# Patient Record
Sex: Female | Born: 1995 | Race: Black or African American | Hispanic: No | Marital: Single | Smoking: Current every day smoker
Health system: Southern US, Community
[De-identification: ages and names within clinical notes are randomized; demographics above are authoritative.]

## PROBLEM LIST (undated history)

## (undated) DIAGNOSIS — K649 Unspecified hemorrhoids: Secondary | ICD-10-CM

## (undated) DIAGNOSIS — K5900 Constipation, unspecified: Secondary | ICD-10-CM

## (undated) HISTORY — PX: OTHER SURGICAL HISTORY: SHX169

## (undated) HISTORY — PX: NOSE SURGERY: SHX723

## (undated) HISTORY — PX: HERNIA REPAIR: SHX51

---
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
## 2013-01-18 ENCOUNTER — Emergency Department (HOSPITAL_COMMUNITY)
Admission: EM | Admit: 2013-01-18 | Discharge: 2013-01-18 | Disposition: A | Discharge status: Home / Self Care | Payer: No Typology Code available for payment source | Source: Home / Self Care | Attending: Family Medicine | Admitting: Family Medicine

## 2013-01-18 ENCOUNTER — Encounter (HOSPITAL_COMMUNITY): Payer: Self-pay | Admitting: Emergency Medicine

## 2013-01-18 DIAGNOSIS — K644 Residual hemorrhoidal skin tags: Secondary | ICD-10-CM

## 2013-01-18 MED ORDER — HYDROCORTISONE ACETATE 25 MG RE SUPP: 25.0000 mg | Freq: Two times a day (BID) | RECTAL | Status: DC

## 2013-01-18 MED ORDER — HYDROCORTISONE 2.5 % RE CREA: TOPICAL_CREAM | RECTAL | Status: DC

## 2013-01-18 NOTE — ED Notes (Signed)
Pt c/o hemorrhoids x 3 days. This is a recurrent problem. Has had regular bowel movements, last was yesterday. Denies abdominal cramping or fever. Took ibuprofen Tuesday with relief. Patient is alert and oriented.

## 2013-01-18 NOTE — ED Provider Notes (Signed)
Medical screening examination/treatment/procedure(s) were performed by non-physician practitioner and as supervising physician I was immediately available for consultation/collaboration.  Juliannah Ohmann, M.D.  Trista Ciocca C Nancyann Cotterman, MD 01/18/13 2115 

## 2013-01-18 NOTE — ED Provider Notes (Signed)
History     CSN: [id number]  Arrival date & time 01/18/13  1214   First MD Initiated Contact with Patient 01/18/13 1345      Chief Complaint  Patient presents with  . Hemorrhoids    Patient is a 17 y.o. female presenting with GI illness. The history is provided by the patient.  GI Problem This is a recurrent problem. The current episode started 3 to 5 hours ago. The problem occurs constantly. The problem has been gradually worsening.  Patient reports recent difficulties with constipation. Earlier today after a BM patient noted painful hemorrhoid. States she has had a hemorrhoids as well in the past. Also admits to a tendency toward constipation. Patient denies abdominal pain or rectal bleeding.  History reviewed. No pertinent past medical history.  History reviewed. No pertinent past surgical history.  No family history on file.  History  Substance Use Topics  . Smoking status: Never Smoker   . Smokeless tobacco: Not on file  . Alcohol Use: No    OB History   Grav Para Term Preterm Abortions TAB SAB Ect Mult Living                  Review of Systems  Constitutional: Negative.   HENT: Negative.   Eyes: Negative.   Respiratory: Negative.   Cardiovascular: Negative.   Gastrointestinal: Positive for constipation.  Genitourinary: Negative.   Musculoskeletal: Negative.   Skin: Negative.   Allergic/Immunologic: Negative.   Neurological: Negative.   Hematological: Negative.   Psychiatric/Behavioral: Negative.     Allergies  Review of patient's allergies indicates not on file.  Home Medications   Current Outpatient Rx  Name  Route  Sig  Dispense  Refill  . hydrocortisone (ANUSOL-HC) 2.5 % rectal cream      Apply to external hemorrhoids 2 times daily and after each bowel movement.   30 g   0   . hydrocortisone (ANUSOL-HC) 25 MG suppository   Rectal   Place 1 suppository (25 mg total) rectally 2 (two) times daily.   12 suppository   0     BP 103/70   Pulse 69  Temp(Src) 98.6 F (37 C) (Oral)  SpO2 100%  LMP 01/13/2013  Physical Exam  Constitutional: She is oriented to person, place, and time. She appears well-developed and well-nourished.  HENT:  Head: Normocephalic and atraumatic.  Eyes: Conjunctivae are normal.  Cardiovascular: Normal rate.   Pulmonary/Chest: Effort normal.  Genitourinary: Rectal exam shows external hemorrhoid.  Approximate 2.5 cm external hemorrhoid noted. Pink and flesh colored. Is not thrombosed. No rectal bleeding.  No other abnormalities noted.  Musculoskeletal: Normal range of motion.  Neurological: She is alert and oriented to person, place, and time.  Skin: Skin is warm and dry.    ED Course  Procedures (including critical care time)  Labs Reviewed - No data to display No results found.   1. Hemorrhoids, external       MDM  Painful external hemorrhoid noted today after recent difficulties w/ constipation. 2.5 cm hemorrhoid, not thrombosed. Will recommend daily stool softner, rest, minimal sitting and treat w/ short course of Anusol supp and cream.         Roma Kayser Laylia Mui, NP 01/18/13 1622

## 2013-01-18 NOTE — ED Notes (Signed)
Patient also recived work note

## 2013-01-22 ENCOUNTER — Encounter (HOSPITAL_COMMUNITY): Payer: Self-pay | Admitting: Emergency Medicine

## 2013-01-22 ENCOUNTER — Emergency Department (HOSPITAL_COMMUNITY)
Admission: EM | Admit: 2013-01-22 | Discharge: 2013-01-22 | Disposition: A | Discharge status: Home / Self Care | Payer: No Typology Code available for payment source | Source: Home / Self Care

## 2013-01-22 DIAGNOSIS — K645 Perianal venous thrombosis: Secondary | ICD-10-CM

## 2013-01-22 HISTORY — DX: Constipation, unspecified: K59.00

## 2013-01-22 MED ORDER — IBUPROFEN 600 MG PO TABS: 600.0000 mg | ORAL_TABLET | Freq: Four times a day (QID) | ORAL | Status: DC | PRN

## 2013-01-22 NOTE — ED Notes (Signed)
Patient aware of reason for delay

## 2013-01-22 NOTE — ED Notes (Signed)
Delay in discharging patient .  Patient requested prescription pain medication .  Also patient needed a school note not found in discharge packet prepared by mabe, np.

## 2013-01-22 NOTE — ED Notes (Signed)
At bedside for Gabrielle Rasmussen, np exam

## 2013-01-22 NOTE — ED Provider Notes (Signed)
History     CSN: [id number]  Arrival date & time 01/22/13  1002   First MD Initiated Contact with Patient 01/22/13 1016      Chief Complaint  Patient presents with  . Hemorrhoids    (Consider location/radiation/quality/duration/timing/severity/associated sxs/prior treatment) HPI Comments: 17 year old female complaining of persistent hemorrhoidal symptoms. She was seen in the urgent care 4 days ago and treated with Anusol cream and suppositories. She has not been taking stool softeners but instead chose to take fiber pills. She states there is no improvement in her hemorrhoid size or discomfort. Denies bleeding. She has a history of constipation and straining.    Past Medical History  Diagnosis Date  . Constipation     History reviewed. No pertinent past surgical history.  No family history on file.  History  Substance Use Topics  . Smoking status: Never Smoker   . Smokeless tobacco: Not on file  . Alcohol Use: No    OB History   Grav Para Term Preterm Abortions TAB SAB Ect Mult Living                  Review of Systems  Constitutional: Negative.   Respiratory: Negative.   Gastrointestinal: Positive for rectal pain. Negative for nausea, vomiting, abdominal pain, diarrhea and anal bleeding.  Genitourinary: Negative.   Skin: Negative.   Psychiatric/Behavioral: Negative.     Allergies  Review of patient's allergies indicates no known allergies.  Home Medications   Current Outpatient Rx  Name  Route  Sig  Dispense  Refill  . hydrocortisone (ANUSOL-HC) 2.5 % rectal cream      Apply to external hemorrhoids 2 times daily and after each bowel movement.   30 g   0   . hydrocortisone (ANUSOL-HC) 25 MG suppository   Rectal   Place 1 suppository (25 mg total) rectally 2 (two) times daily.   12 suppository   0     BP 112/77  Pulse 85  Temp(Src) 98 F (36.7 C) (Oral)  Resp 16  SpO2 100%  LMP 01/13/2013  Physical Exam  Nursing note and vitals  reviewed. Constitutional: She is oriented to person, place, and time. She appears well-developed and well-nourished. No distress.  Eyes: EOM are normal.  Neck: Normal range of motion. Neck supple.  Pulmonary/Chest: Effort normal.  Genitourinary:  Large nonbleeding hemorroid, tender.   Musculoskeletal: Normal range of motion. She exhibits no edema.  Neurological: She is alert and oriented to person, place, and time. She exhibits normal muscle tone.  Skin: Skin is warm and dry. No rash noted.  Psychiatric: She has a normal mood and affect.    ED Course  Thrombectomy Date/Time: 01/22/2013 11:01 AM Performed by: Phineas Real, Keyani Rigdon Authorized by: Bradd Canary D Consent: Verbal consent obtained. Risks and benefits: risks, benefits and alternatives were discussed Consent given by: patient Patient understanding: patient states understanding of the procedure being performed Patient identity confirmed: verbally with patient Preparation: Patient was prepped and draped in the usual sterile fashion. Local anesthesia used: yes Local anesthetic: lidocaine 2% with epinephrine Anesthetic total: 2 ml Patient sedated: no Patient tolerance: Patient tolerated the procedure well with no immediate complications. Comments: A 2 cm longitudinal incision was made over the hemorrhoid. The clot was expressed and the contents cleaned. After removal of the contents a dressing was placed over the op site.   (including critical care time)  Labs Reviewed - No data to display No results found.   1. Thrombosed external hemorrhoid  MDM  The patient was instructed to go home and sit in a very warm tub or and soak. To that 2-3 times a day if possible. Keep the incision to him he recovered with a bandage for the next 3-4 days, as long as straining. Postop, bleeding was quite minimal. Continue with the topical hydrocortisone cream and Anusol suppositories. Obtain the stool softener and use 2-3 times a day to soften  the stool and continued fiber contents. Recommend cleaning the area with liquid spray such as in a bottle and careful dabbing her wiping the area. Recheck promptly for any new symptoms problems or worsening        Hayden Rasmussen, NP 01/22/13 1106  Hayden Rasmussen, NP 01/22/13 [phone]

## 2013-01-22 NOTE — ED Notes (Signed)
Reports being seen before for hemorrhoids per patient.  Reports being seen Friday.  Patient reports the same pain.

## 2013-01-22 NOTE — ED Notes (Signed)
Assisted with procedure in treatment room.  Patient currently dressing

## 2013-01-25 NOTE — ED Provider Notes (Signed)
Medical screening examination/treatment/procedure(s) were performed by resident physician or non-physician practitioner and as supervising physician I was immediately available for consultation/collaboration.   Barkley Bruns MD.   Linna Hoff, MD 01/25/13 2017

## 2014-05-05 ENCOUNTER — Encounter (HOSPITAL_COMMUNITY): Payer: Self-pay | Admitting: Emergency Medicine

## 2014-05-05 ENCOUNTER — Emergency Department (HOSPITAL_COMMUNITY)
Admission: EM | Admit: 2014-05-05 | Discharge: 2014-05-05 | Disposition: A | Discharge status: Home / Self Care | Payer: No Typology Code available for payment source | Attending: Emergency Medicine | Admitting: Emergency Medicine

## 2014-05-05 ENCOUNTER — Emergency Department (HOSPITAL_COMMUNITY): Payer: No Typology Code available for payment source

## 2014-05-05 ENCOUNTER — Emergency Department (HOSPITAL_COMMUNITY)
Admission: EM | Admit: 2014-05-05 | Discharge: 2014-05-06 | Disposition: A | Discharge status: Home / Self Care | Payer: No Typology Code available for payment source | Attending: Emergency Medicine | Admitting: Emergency Medicine

## 2014-05-05 DIAGNOSIS — F17200 Nicotine dependence, unspecified, uncomplicated: Secondary | ICD-10-CM | POA: Insufficient documentation

## 2014-05-05 DIAGNOSIS — R1033 Periumbilical pain: Secondary | ICD-10-CM | POA: Insufficient documentation

## 2014-05-05 DIAGNOSIS — N390 Urinary tract infection, site not specified: Secondary | ICD-10-CM | POA: Insufficient documentation

## 2014-05-05 DIAGNOSIS — Z792 Long term (current) use of antibiotics: Secondary | ICD-10-CM | POA: Insufficient documentation

## 2014-05-05 DIAGNOSIS — Z3202 Encounter for pregnancy test, result negative: Secondary | ICD-10-CM | POA: Insufficient documentation

## 2014-05-05 DIAGNOSIS — R112 Nausea with vomiting, unspecified: Secondary | ICD-10-CM | POA: Insufficient documentation

## 2014-05-05 LAB — URINALYSIS, ROUTINE W REFLEX MICROSCOPIC
BILIRUBIN URINE: NEGATIVE
Glucose, UA: NEGATIVE mg/dL
HGB URINE DIPSTICK: NEGATIVE
KETONES UR: NEGATIVE mg/dL
NITRITE: NEGATIVE
PROTEIN: NEGATIVE mg/dL
Specific Gravity, Urine: 1.028 (ref 1.005–1.030)
UROBILINOGEN UA: 1 mg/dL (ref 0.0–1.0)
pH: 6.5 (ref 5.0–8.0)

## 2014-05-05 LAB — COMPREHENSIVE METABOLIC PANEL
ALT: 12 U/L (ref 0–35)
ANION GAP: 16 — AB (ref 5–15)
AST: 28 U/L (ref 0–37)
Albumin: 4.4 g/dL (ref 3.5–5.2)
Alkaline Phosphatase: 81 U/L (ref 39–117)
BILIRUBIN TOTAL: 0.8 mg/dL (ref 0.3–1.2)
BUN: 12 mg/dL (ref 6–23)
CALCIUM: 9.8 mg/dL (ref 8.4–10.5)
CHLORIDE: 101 meq/L (ref 96–112)
CO2: 22 meq/L (ref 19–32)
CREATININE: 0.61 mg/dL (ref 0.50–1.10)
GLUCOSE: 110 mg/dL — AB (ref 70–99)
Potassium: 3.7 mEq/L (ref 3.7–5.3)
Sodium: 139 mEq/L (ref 137–147)
Total Protein: 8.4 g/dL — ABNORMAL HIGH (ref 6.0–8.3)

## 2014-05-05 LAB — CBC WITH DIFFERENTIAL/PLATELET
BASOS ABS: 0 10*3/uL (ref 0.0–0.1)
Basophils Relative: 0 % (ref 0–1)
EOS PCT: 2 % (ref 0–5)
Eosinophils Absolute: 0.1 10*3/uL (ref 0.0–0.7)
HEMATOCRIT: 45.8 % (ref 36.0–46.0)
HEMOGLOBIN: 15.9 g/dL — AB (ref 12.0–15.0)
LYMPHS PCT: 39 % (ref 12–46)
Lymphs Abs: 2.2 10*3/uL (ref 0.7–4.0)
MCH: 29.2 pg (ref 26.0–34.0)
MCHC: 34.7 g/dL (ref 30.0–36.0)
MCV: 84 fL (ref 78.0–100.0)
MONO ABS: 0.6 10*3/uL (ref 0.1–1.0)
MONOS PCT: 10 % (ref 3–12)
Neutro Abs: 2.8 10*3/uL (ref 1.7–7.7)
Neutrophils Relative %: 49 % (ref 43–77)
Platelets: 225 10*3/uL (ref 150–400)
RBC: 5.45 MIL/uL — ABNORMAL HIGH (ref 3.87–5.11)
RDW: 12.8 % (ref 11.5–15.5)
WBC: 5.7 10*3/uL (ref 4.0–10.5)

## 2014-05-05 LAB — URINE MICROSCOPIC-ADD ON

## 2014-05-05 LAB — PREGNANCY, URINE: PREG TEST UR: NEGATIVE

## 2014-05-05 LAB — LIPASE, BLOOD: LIPASE: 42 U/L (ref 11–59)

## 2014-05-05 MED ORDER — SODIUM CHLORIDE 0.9 % IV SOLN
1000.0000 mL | Freq: Once | INTRAVENOUS | Status: AC
  Administered 2014-05-05: 1000 mL via INTRAVENOUS

## 2014-05-05 MED ORDER — ONDANSETRON 4 MG PO TBDP: 4.0000 mg | ORAL_TABLET | Freq: Three times a day (TID) | ORAL | Status: DC | PRN

## 2014-05-05 MED ORDER — IOHEXOL 300 MG/ML  SOLN
100.0000 mL | Freq: Once | INTRAMUSCULAR | Status: AC | PRN
  Administered 2014-05-05: 100 mL via INTRAVENOUS

## 2014-05-05 MED ORDER — ONDANSETRON HCL 4 MG/2ML IJ SOLN
4.0000 mg | Freq: Once | INTRAMUSCULAR | Status: AC
  Administered 2014-05-05: 4 mg via INTRAVENOUS
  Filled 2014-05-05: qty 2

## 2014-05-05 MED ORDER — NITROFURANTOIN MONOHYD MACRO 100 MG PO CAPS: 100.0000 mg | ORAL_CAPSULE | Freq: Two times a day (BID) | ORAL | Status: DC

## 2014-05-05 MED ORDER — HYDROMORPHONE HCL PF 1 MG/ML IJ SOLN
0.5000 mg | Freq: Once | INTRAMUSCULAR | Status: AC
  Administered 2014-05-05: 0.5 mg via INTRAVENOUS
  Filled 2014-05-05: qty 1

## 2014-05-05 MED ORDER — NITROFURANTOIN MONOHYD MACRO 100 MG PO CAPS
100.0000 mg | ORAL_CAPSULE | Freq: Once | ORAL | Status: AC
  Administered 2014-05-05: 100 mg via ORAL
  Filled 2014-05-05: qty 1

## 2014-05-05 MED ORDER — IOHEXOL 300 MG/ML  SOLN
50.0000 mL | Freq: Once | INTRAMUSCULAR | Status: AC | PRN
  Administered 2014-05-05: 50 mL via ORAL

## 2014-05-05 NOTE — Discharge Instructions (Signed)
Your evaluation has been largely reassuring.  Your pain and nausea are likely due to a urinary tract infection.   It is important to follow up with her primary care physician to ensure appropriate resolution of your symptoms.   There were areas of sclerosis of bone visualized on the CT scan.  This should be discussed with your physician.

## 2014-05-05 NOTE — ED Provider Notes (Signed)
CSN: [id number]     Arrival date & time 05/05/14  0400 History   First MD Initiated Contact with Patient 05/05/14 0402     Chief Complaint  Patient presents with  . Abdominal Pain  . Nausea  . Emesis     (Consider location/radiation/quality/duration/timing/severity/associated sxs/prior Treatment) HPI Patient presents with worsening periumbilical pain, nausea, vomiting. Symptoms began to his ago, initially with pain.  Since onset the pain is increased, nausea has increased, and the patient has been intolerant of oral intake. The pain is periumbilical, nonradiating, with no lower abdominal pain, no lateral abdominal pain. No clear alleviating or exacerbating factors. Patient states that she was in her usual state of health prior to the onset of symptoms.  Past Medical History  Diagnosis Date  . Constipation    Past Surgical History  Procedure Laterality Date  . Hemorroid removal     History reviewed. No pertinent family history. History  Substance Use Topics  . Smoking status: Current Every Day Smoker    Types: Pipe  . Smokeless tobacco: Current User  . Alcohol Use: No   OB History   Grav Para Term Preterm Abortions TAB SAB Ect Mult Living                 Review of Systems  Constitutional:       Per HPI, otherwise negative  HENT:       Per HPI, otherwise negative  Respiratory:       Per HPI, otherwise negative  Cardiovascular:       Per HPI, otherwise negative  Gastrointestinal: Positive for vomiting.  Endocrine:       Negative aside from HPI  Genitourinary:       Neg aside from HPI   Musculoskeletal:       Per HPI, otherwise negative  Skin: Negative.   Neurological: Negative for syncope.      Allergies  Review of patient's allergies indicates no known allergies.  Home Medications   Prior to Admission medications   Not on File   BP 139/87  Pulse 75  Temp(Src) 98.6 F (37 C) (Oral)  Resp 18  Ht 5\' 10"  (1.778 m)  Wt 140 lb (63.504 kg)  BMI 20.09  kg/m2  SpO2 100%  LMP 04/21/2014 Physical Exam  Nursing note and vitals reviewed. Constitutional: She is oriented to person, place, and time. She appears well-developed and well-nourished. No distress.  HENT:  Head: Normocephalic and atraumatic.  Eyes: Conjunctivae and EOM are normal.  Cardiovascular: Normal rate and regular rhythm.   Pulmonary/Chest: Effort normal and breath sounds normal. No stridor. No respiratory distress.  Abdominal: She exhibits no distension. There is no hepatosplenomegaly. There is tenderness in the periumbilical area. There is no rigidity, no rebound, no guarding and no CVA tenderness.    Musculoskeletal: She exhibits no edema.  Neurological: She is alert and oriented to person, place, and time. No cranial nerve deficit.  Skin: Skin is warm and dry.  Psychiatric: She has a normal mood and affect.    ED Course  Procedures (including critical care time) Labs Review Labs Reviewed  CBC WITH DIFFERENTIAL - Abnormal; Notable for the following:    RBC 5.45 (*)    Hemoglobin 15.9 (*)    All other components within normal limits  COMPREHENSIVE METABOLIC PANEL - Abnormal; Notable for the following:    Glucose, Bld 110 (*)    Total Protein 8.4 (*)    Anion gap 16 (*)    All  other components within normal limits  URINALYSIS, ROUTINE W REFLEX MICROSCOPIC - Abnormal; Notable for the following:    APPearance CLOUDY (*)    Leukocytes, UA MODERATE (*)    All other components within normal limits  LIPASE, BLOOD  PREGNANCY, URINE  URINE MICROSCOPIC-ADD ON    Imaging Review Ct Abdomen Pelvis W Contrast  05/05/2014   CLINICAL DATA:  Lower abdominal pain.  Nausea.  Emesis.  EXAM: CT ABDOMEN AND PELVIS WITH CONTRAST  TECHNIQUE: Multidetector CT imaging of the abdomen and pelvis was performed using the standard protocol following bolus administration of intravenous contrast.  CONTRAST:  180mL OMNIPAQUE IOHEXOL 300 MG/ML  SOLN  COMPARISON:  None.  FINDINGS: The visualized  lung bases are clear.  The liver demonstrates a normal contrast enhanced appearance. Gallbladder within normal limits. No biliary ductal dilatation. The spleen, adrenal glands, and pancreas demonstrate a normal contrast enhanced appearance.  Kidneys are equal in size with symmetric enhancement. No nephrolithiasis, hydronephrosis, or focal enhancing renal mass.  Stomach within normal limits. No evidence of bowel obstruction. Appendix well visualized in the right lower quadrant and is of normal caliber and appearance without associated inflammatory changes to suggest acute appendicitis. No abnormal wall thickening, mucosal enhancement, or inflammatory changes seen about the bowels.  Single loculated gas present within the nondependent portion of the bladder lumen (Series 2, image 84), indeterminate. No significant bladder wall thickening. The  Uterus within normal limits. Probable small physiologic cysts noted within the left ovary. Ovaries are otherwise unremarkable.  Small volume free fluid present within the pelvis, likely physiologic. No free intraperitoneal air. Normal intravascular enhancement seen throughout the abdomen and pelvis. No pathologically enlarged intra-abdominal pelvic lymph nodes.  No acute osseous abnormality. Somewhat ill defined sclerotic lesions seen involving the right aspect of the T11 and T12 vertebral bodies (series 4, image 47). Patchy sclerotic lesion also present within the left sacral ala and right iliac wing (series 2, image 64). Small lesion also present within the right aspect of the L5 vertebral body (series 2, image 59). The largest of these lesions is present in T12 and measures approximately 2.5 x 1.9 x 1.7 cm.  IMPRESSION: 1. No acute intra-abdominal or pelvic process identified. 2. Ill defined sclerotic lesions involving the T11, T12, and L5 vertebral bodies as well as the left sacral ala and right iliac wing. While these findings are of uncertain etiology, possible osseous  metastases should be considered. Correlation with bone scan is recommended. 3. Normal appendix. 4. Small volume free fluid within the pelvis, likely physiologic. 5. Single loculated of gas within the nondependent aspect of the bladder lumen, of uncertain etiology and significance. Query recent catheterization.   Electronically Signed   By: Jeannine Boga M.D.   On: 05/05/2014 06:21    6:27 AM On repeat exam the patient is calm, talking on the telephone, watching "Gershon Mussel and Sonia Side". She states that she feels better.  I informed her of all results.  MDM  This generally well young female presents with several days of epigastric pain, nausea, vomiting.  The patient's labs were largely reassuring.  The patient had resolution of her pain. CT scan did not demonstrate appendicitis, and though there is some mention of sclerotic bony changes, patient has no other overt stigmata of malignancy.  Patient will followup in regards to this with her primary care physician. Patient did have evidence of a urinary tract infection, was started on antibiotics, discharged in stable condition.     Carmin Muskrat, MD  05/05/14 0629 

## 2014-05-05 NOTE — ED Notes (Signed)
Pt presents with c/o of RLQ qnd LLQ abdominal pain (8/10), N/V denies diarrhea. Pt in NAD

## 2014-05-05 NOTE — ED Notes (Signed)
Bed: ER15 Expected date:  Expected time:  Means of arrival:  Comments: EMS 64F N/V

## 2014-05-05 NOTE — ED Notes (Signed)
Per EMS pt states she started having pain 2 days ago and was seen here for same and was discharged this morning  Pt was diagnosed with a UTI  Pt states her pain continues and states she is having nausea and vomiting

## 2014-05-06 LAB — HEPATIC FUNCTION PANEL
ALBUMIN: 4 g/dL (ref 3.5–5.2)
ALK PHOS: 77 U/L (ref 39–117)
ALT: 10 U/L (ref 0–35)
AST: 19 U/L (ref 0–37)
BILIRUBIN TOTAL: 1.1 mg/dL (ref 0.3–1.2)
Bilirubin, Direct: 0.2 mg/dL (ref 0.0–0.3)
Indirect Bilirubin: 0.9 mg/dL (ref 0.3–0.9)
TOTAL PROTEIN: 7.6 g/dL (ref 6.0–8.3)

## 2014-05-06 LAB — WET PREP, GENITAL
Clue Cells Wet Prep HPF POC: NONE SEEN
Trich, Wet Prep: NONE SEEN
Yeast Wet Prep HPF POC: NONE SEEN

## 2014-05-06 LAB — CBC
HEMATOCRIT: 42.2 % (ref 36.0–46.0)
HEMOGLOBIN: 14.5 g/dL (ref 12.0–15.0)
MCH: 29.5 pg (ref 26.0–34.0)
MCHC: 34.4 g/dL (ref 30.0–36.0)
MCV: 85.8 fL (ref 78.0–100.0)
Platelets: 211 10*3/uL (ref 150–400)
RBC: 4.92 MIL/uL (ref 3.87–5.11)
RDW: 12.9 % (ref 11.5–15.5)
WBC: 8.3 10*3/uL (ref 4.0–10.5)

## 2014-05-06 LAB — URINE MICROSCOPIC-ADD ON

## 2014-05-06 LAB — BASIC METABOLIC PANEL
Anion gap: 15 (ref 5–15)
BUN: 10 mg/dL (ref 6–23)
CALCIUM: 9.3 mg/dL (ref 8.4–10.5)
CO2: 21 meq/L (ref 19–32)
Chloride: 103 mEq/L (ref 96–112)
Creatinine, Ser: 0.69 mg/dL (ref 0.50–1.10)
GFR calc Af Amer: >90 mL/min (ref >90)
GLUCOSE: 88 mg/dL (ref 70–99)
POTASSIUM: 3.4 meq/L — AB (ref 3.7–5.3)
SODIUM: 139 meq/L (ref 137–147)

## 2014-05-06 LAB — URINALYSIS, ROUTINE W REFLEX MICROSCOPIC
Bilirubin Urine: NEGATIVE
Glucose, UA: NEGATIVE mg/dL
Hgb urine dipstick: NEGATIVE
Ketones, ur: >80 mg/dL — AB
NITRITE: NEGATIVE
PROTEIN: 30 mg/dL — AB
SPECIFIC GRAVITY, URINE: 1.025 (ref 1.005–1.030)
UROBILINOGEN UA: 1 mg/dL (ref 0.0–1.0)
pH: 8.5 — ABNORMAL HIGH (ref 5.0–8.0)

## 2014-05-06 LAB — PREGNANCY, URINE: PREG TEST UR: NEGATIVE

## 2014-05-06 MED ORDER — ONDANSETRON HCL 4 MG/2ML IJ SOLN
4.0000 mg | INTRAMUSCULAR | Status: AC
  Administered 2014-05-06: 4 mg via INTRAVENOUS
  Filled 2014-05-06: qty 2

## 2014-05-06 MED ORDER — MORPHINE SULFATE 4 MG/ML IJ SOLN
4.0000 mg | Freq: Once | INTRAMUSCULAR | Status: AC
  Administered 2014-05-06: 4 mg via INTRAVENOUS
  Filled 2014-05-06: qty 1

## 2014-05-06 MED ORDER — SODIUM CHLORIDE 0.9 % IV BOLUS (SEPSIS)
1000.0000 mL | Freq: Once | INTRAVENOUS | Status: AC
  Administered 2014-05-06: 1000 mL via INTRAVENOUS

## 2014-05-06 MED ORDER — PROMETHAZINE HCL 25 MG PO TABS: 25.0000 mg | ORAL_TABLET | Freq: Four times a day (QID) | ORAL | Status: DC | PRN

## 2014-05-06 MED ORDER — PROMETHAZINE HCL 25 MG/ML IJ SOLN
12.5000 mg | Freq: Once | INTRAMUSCULAR | Status: AC
  Administered 2014-05-06: 12.5 mg via INTRAVENOUS
  Filled 2014-05-06: qty 1

## 2014-05-06 NOTE — ED Notes (Signed)
Pt presents with numbness & tingling in her extremities. She was seen in ED yesterday for UTI. Abdominal pain after urination in last few hours. Pt states the pain in her abdomen coupled with tingling caused her to call for ambulance to pick her up and bring her to hospital. Pain in abdomen is in umbilical area when she has it (intermittent), but is presently not experiencing any pain. NAD att.

## 2014-05-06 NOTE — ED Provider Notes (Signed)
Medical screening examination/treatment/procedure(s) were performed by non-physician practitioner and as supervising physician I was immediately available for consultation/collaboration.   EKG Interpretation None        Hoy Morn, MD 05/06/14 [phone]

## 2014-05-06 NOTE — ED Notes (Signed)
Pt given water to drink. 

## 2014-05-06 NOTE — Discharge Instructions (Signed)
Recommend you continue taking your antibiotics. Take zofran and/or phenergan for persistent nausea. Follow up with your primary care doctor at your scheduled appointment. Recommend you eat bland foods until symptoms resolve; drink plenty of clear liquids. Avoid fatty, greasy, or fried foods and milk products.   Abdominal Pain Many things can cause abdominal pain. Usually, abdominal pain is not caused by a disease and will improve without treatment. It can often be observed and treated at home. Your health care provider will do a physical exam and possibly order blood tests and X-rays to help determine the seriousness of your pain. However, in many cases, more time must pass before a clear cause of the pain can be found. Before that point, your health care provider may not know if you need more testing or further treatment. HOME CARE INSTRUCTIONS  Monitor your abdominal pain for any changes. The following actions may help to alleviate any discomfort you are experiencing:  Only take over-the-counter or prescription medicines as directed by your health care provider.  Do not take laxatives unless directed to do so by your health care provider.  Try a clear liquid diet (broth, tea, or water) as directed by your health care provider. Slowly move to a bland diet as tolerated. SEEK MEDICAL CARE IF:  You have unexplained abdominal pain.  You have abdominal pain associated with nausea or diarrhea.  You have pain when you urinate or have a bowel movement.  You experience abdominal pain that wakes you in the night.  You have abdominal pain that is worsened or improved by eating food.  You have abdominal pain that is worsened with eating fatty foods.  You have a fever. SEEK IMMEDIATE MEDICAL CARE IF:   Your pain does not go away within 2 hours.  You keep throwing up (vomiting).  Your pain is felt only in portions of the abdomen, such as the right side or the left lower portion of the  abdomen.  You pass bloody or black tarry stools. MAKE SURE YOU:  Understand these instructions.   Will watch your condition.   Will get help right away if you are not doing well or get worse.  Document Released: 06/29/2005 Document Revised: 09/24/2013 Document Reviewed: 05/29/2013 [facility] Patient Information 2015 [place], [place]. This information is not intended to replace advice given to you by your health care provider. Make sure you discuss any questions you have with your health care provider.

## 2014-05-06 NOTE — ED Provider Notes (Signed)
CSN: [id number]     Arrival date & time 05/05/14  2237 History   First MD Initiated Contact with Patient 05/06/14 0204     Chief Complaint  Patient presents with  . Abdominal Pain    (Consider location/radiation/quality/duration/timing/severity/associated sxs/prior Treatment) HPI Comments: Patient is a 18 year old female who presents to the emergency department for abdominal pain. Patient states that abdominal pain began 5 days ago with associated vomiting. She describes the pain as sharp and periumbilical. Symptoms also associated with persistent emesis. Patient was evaluated in the emergency department for symptoms yesterday and diagnosed with a urinary tract infection. She has been taking Macrobid as prescribed.  She states that her symptoms had temporarily subsided when she decided to eat a salad. She states that shortly after this she began to feel nauseous with a few episodes of nonbloody, nonbilious emesis. Patient also endorses feeling lightheaded. Patient denies associated fever, chest pain, shortness of breath, dysuria, vaginal complaints, melena or hematochezia, diarrhea, loss of consciousness, and recent travel. She states her last bowel movement was 2 days ago and was normal. Abdominal surgical history significant for hernia repair as a child.  Patient is a 18 y.o. female presenting with abdominal pain. The history is provided by the patient. No language interpreter was used.  Abdominal Pain Associated symptoms: nausea and vomiting     Past Medical History  Diagnosis Date  . Constipation    Past Surgical History  Procedure Laterality Date  . Hemorroid removal     History reviewed. No pertinent family history. History  Substance Use Topics  . Smoking status: Current Every Day Smoker    Types: Pipe  . Smokeless tobacco: Current User  . Alcohol Use: No   OB History   Grav Para Term Preterm Abortions TAB SAB Ect Mult Living                  Review of Systems   Gastrointestinal: Positive for nausea, vomiting and abdominal pain.  All other systems reviewed and are negative.    Allergies  Review of patient's allergies indicates no known allergies.  Home Medications   Prior to Admission medications   Medication Sig Start Date End Date Taking? Authorizing Provider  nitrofurantoin, macrocrystal-monohydrate, (MACROBID) 100 MG capsule Take 1 capsule (100 mg total) by mouth 2 (two) times daily. 05/05/14  Yes Carmin Muskrat, MD  ondansetron (ZOFRAN ODT) 4 MG disintegrating tablet Take 1 tablet (4 mg total) by mouth every 8 (eight) hours as needed for nausea or vomiting. 05/05/14  Yes Carmin Muskrat, MD  promethazine (PHENERGAN) 25 MG tablet Take 1 tablet (25 mg total) by mouth every 6 (six) hours as needed for nausea or vomiting. 05/06/14   Antonietta Breach, PA-C   BP 135/78  Pulse 70  Temp(Src) 98.7 F (37.1 C) (Oral)  Resp 18  SpO2 100%  LMP 04/21/2014  Physical Exam  Nursing note and vitals reviewed. Constitutional: She is oriented to person, place, and time. She appears well-developed and well-nourished. No distress.  Nontoxic/nonseptic appearing  HENT:  Head: Normocephalic and atraumatic.  Eyes: Conjunctivae and EOM are normal. No scleral icterus.  Neck: Normal range of motion.  Cardiovascular: Normal rate, regular rhythm and normal heart sounds.   Pulmonary/Chest: Effort normal and breath sounds normal. No respiratory distress. She has no wheezes. She has no rales.  Abdominal: Soft. She exhibits no distension. There is tenderness. There is no rebound and no guarding.  Abdomen soft with mild periumbilical tenderness on deep palpation. No peritoneal  signs or involuntary guarding. No tenderness to palpation at McBurney's point. Negative Murphy's sign.  Genitourinary: Vagina normal and uterus normal. There is no rash, tenderness, lesion or injury on the right labia. There is no rash, tenderness, lesion or injury on the left labia. Uterus is not  tender. Cervix exhibits no motion tenderness, no discharge and no friability. Right adnexum displays no mass, no tenderness and no fullness. Left adnexum displays no mass, no tenderness and no fullness.  GU exam unremarkable.  Musculoskeletal: Normal range of motion.  Neurological: She is alert and oriented to person, place, and time. She exhibits normal muscle tone. Coordination normal.  GCS 15. Patient ambulates to the bathroom with slow, steady gait.  Skin: Skin is warm and dry. No rash noted. She is not diaphoretic. No erythema. No pallor.  Psychiatric: She has a normal mood and affect. Her behavior is normal.    ED Course  Procedures (including critical care time) Labs Review Labs Reviewed  WET PREP, GENITAL - Abnormal; Notable for the following:    WBC, Wet Prep HPF POC FEW (*)    All other components within normal limits  BASIC METABOLIC PANEL - Abnormal; Notable for the following:    Potassium 3.4 (*)    All other components within normal limits  URINALYSIS, ROUTINE W REFLEX MICROSCOPIC - Abnormal; Notable for the following:    APPearance CLOUDY (*)    pH 8.5 (*)    Ketones, ur >80 (*)    Protein, ur 30 (*)    Leukocytes, UA SMALL (*)    All other components within normal limits  URINE MICROSCOPIC-ADD ON - Abnormal; Notable for the following:    Squamous Epithelial / LPF FEW (*)    All other components within normal limits  GC/CHLAMYDIA PROBE AMP  CBC  HEPATIC FUNCTION PANEL  PREGNANCY, URINE   Imaging Review Ct Abdomen Pelvis W Contrast  05/05/2014   CLINICAL DATA:  Lower abdominal pain.  Nausea.  Emesis.  EXAM: CT ABDOMEN AND PELVIS WITH CONTRAST  TECHNIQUE: Multidetector CT imaging of the abdomen and pelvis was performed using the standard protocol following bolus administration of intravenous contrast.  CONTRAST:  172mL OMNIPAQUE IOHEXOL 300 MG/ML  SOLN  COMPARISON:  None.  FINDINGS: The visualized lung bases are clear.  The liver demonstrates a normal contrast enhanced  appearance. Gallbladder within normal limits. No biliary ductal dilatation. The spleen, adrenal glands, and pancreas demonstrate a normal contrast enhanced appearance.  Kidneys are equal in size with symmetric enhancement. No nephrolithiasis, hydronephrosis, or focal enhancing renal mass.  Stomach within normal limits. No evidence of bowel obstruction. Appendix well visualized in the right lower quadrant and is of normal caliber and appearance without associated inflammatory changes to suggest acute appendicitis. No abnormal wall thickening, mucosal enhancement, or inflammatory changes seen about the bowels.  Single loculated gas present within the nondependent portion of the bladder lumen (Series 2, image 84), indeterminate. No significant bladder wall thickening. The  Uterus within normal limits. Probable small physiologic cysts noted within the left ovary. Ovaries are otherwise unremarkable.  Small volume free fluid present within the pelvis, likely physiologic. No free intraperitoneal air. Normal intravascular enhancement seen throughout the abdomen and pelvis. No pathologically enlarged intra-abdominal pelvic lymph nodes.  No acute osseous abnormality. Somewhat ill defined sclerotic lesions seen involving the right aspect of the T11 and T12 vertebral bodies (series 4, image 47). Patchy sclerotic lesion also present within the left sacral ala and right iliac wing (series 2, image  64). Small lesion also present within the right aspect of the L5 vertebral body (series 2, image 59). The largest of these lesions is present in T12 and measures approximately 2.5 x 1.9 x 1.7 cm.  IMPRESSION: 1. No acute intra-abdominal or pelvic process identified. 2. Ill defined sclerotic lesions involving the T11, T12, and L5 vertebral bodies as well as the left sacral ala and right iliac wing. While these findings are of uncertain etiology, possible osseous metastases should be considered. Correlation with bone scan is recommended.  3. Normal appendix. 4. Small volume free fluid within the pelvis, likely physiologic. 5. Single loculated of gas within the nondependent aspect of the bladder lumen, of uncertain etiology and significance. Query recent catheterization.   Electronically Signed   By: Jeannine Boga M.D.   On: 05/05/2014 06:21     EKG Interpretation None      MDM   Final diagnoses:  Periumbilical abdominal pain  Non-intractable vomiting with nausea, vomiting of unspecified type    18 year old female presents to the emergency department for persistent periumbilical abdominal pain with nausea and vomiting. Patient seen and evaluated in the emergency department for this complaint yesterday at which time she was diagnosed with a urinary tract infection. Patient today as well and nontoxic appearing, hemodynamically stable, and afebrile. Abdominal exam significant for mild periumbilical tenderness on deep palpation without peritoneal signs or guarding.  Labs completed and compared to yesterday's workup. Labs are unremarkable and stable as compared to prior. Pelvic exam also completed today which is unremarkable; negative for cervical motion tenderness and adnexal tenderness. Urinalysis suggests dehydration only. IVF given.  Patient has had improvement in her nausea in the emergency department with Zofran and Phenergan. Pain improved with morphine. Abdominal reexaminations stable. CT from yesterday reviewed which shows no acute abdominopelvic process. Do not believe repeat imaging is indicated at this time. Her symptoms may be secondary to urinary tract infection for which patient is being treated. Viral etiology also a possibility.   Have discussed symptomatic management with the patient including continued treatment with Macrobid and Zofran. Will prescribe Phenergan for persistent nausea as she seemed to respond better to this in the ED today; able to tolerate POs. Patient has a followup with her primary care  provider tomorrow. I recommended that patient keep this appointment for followup. Return precautions provided and patient agreeable to plan with no unaddressed concerns. Patient discharged in good condition.   Filed Vitals:   05/05/14 2237 05/05/14 2240 05/06/14 0200 05/06/14 0450  BP:  131/96 135/78 107/61  Pulse:  88 70 56  Temp:  98.7 F (37.1 C)    TempSrc:  Oral    Resp:  20 18 16   SpO2: 98% 100% 100% 100%      Antonietta Breach, PA-C 05/06/14 0510

## 2014-05-07 LAB — GC/CHLAMYDIA PROBE AMP
CT PROBE, AMP APTIMA: NEGATIVE
GC Probe RNA: NEGATIVE

## 2014-05-29 DIAGNOSIS — D480 Neoplasm of uncertain behavior of bone and articular cartilage: Secondary | ICD-10-CM | POA: Insufficient documentation

## 2014-12-12 ENCOUNTER — Emergency Department (HOSPITAL_COMMUNITY)
Admission: EM | Admit: 2014-12-12 | Discharge: 2014-12-12 | Disposition: A | Discharge status: Home / Self Care | Payer: No Typology Code available for payment source | Source: Home / Self Care | Attending: Emergency Medicine | Admitting: Emergency Medicine

## 2014-12-12 ENCOUNTER — Encounter (HOSPITAL_COMMUNITY): Payer: Self-pay | Admitting: Emergency Medicine

## 2014-12-12 ENCOUNTER — Other Ambulatory Visit (HOSPITAL_COMMUNITY)
Admission: RE | Admit: 2014-12-12 | Discharge: 2014-12-12 | Disposition: A | Discharge status: Home / Self Care | Payer: No Typology Code available for payment source | Source: Ambulatory Visit | Attending: Emergency Medicine | Admitting: Emergency Medicine

## 2014-12-12 DIAGNOSIS — Z113 Encounter for screening for infections with a predominantly sexual mode of transmission: Secondary | ICD-10-CM | POA: Insufficient documentation

## 2014-12-12 DIAGNOSIS — J029 Acute pharyngitis, unspecified: Secondary | ICD-10-CM

## 2014-12-12 DIAGNOSIS — N760 Acute vaginitis: Secondary | ICD-10-CM | POA: Insufficient documentation

## 2014-12-12 LAB — POCT URINALYSIS DIP (DEVICE)
Bilirubin Urine: NEGATIVE
GLUCOSE, UA: NEGATIVE mg/dL
Hgb urine dipstick: NEGATIVE
Ketones, ur: NEGATIVE mg/dL
Leukocytes, UA: NEGATIVE
NITRITE: NEGATIVE
Protein, ur: NEGATIVE mg/dL
Specific Gravity, Urine: 1.02 (ref 1.005–1.030)
UROBILINOGEN UA: 2 mg/dL — AB (ref 0.0–1.0)
pH: 7.5 (ref 5.0–8.0)

## 2014-12-12 LAB — POCT RAPID STREP A: Streptococcus, Group A Screen (Direct): NEGATIVE

## 2014-12-12 LAB — POCT PREGNANCY, URINE: Preg Test, Ur: NEGATIVE

## 2014-12-12 MED ORDER — AMOXICILLIN 500 MG PO CAPS: 500.0000 mg | ORAL_CAPSULE | Freq: Two times a day (BID) | ORAL | Status: DC

## 2014-12-12 NOTE — ED Notes (Signed)
C/o ST onset yest; hurts to swallow Denies fevers, chills Also c/o UTI sx onset 1 wee w/vag d/c Sx include urinary freq, dysuria, abd pain Alert, no signs of acute distress.

## 2014-12-12 NOTE — ED Provider Notes (Signed)
CSN: [id number]     Arrival date & time 12/12/14  64 History   First MD Initiated Contact with Patient 12/12/14 1454     Chief Complaint  Patient presents with  . Sore Throat  . Urinary Tract Infection   (Consider location/radiation/quality/duration/timing/severity/associated sxs/prior Treatment) HPI She is a 19 year old woman here for evaluation of sore throat and possible bladder infection. She states the sore throat started yesterday. It is painful to swallow. She reports some mild postnasal drip as well as a mild dry cough. No fevers. No known sick contacts.  She also reports about one week of dysuria, urinary frequency, urinary urgency. She reports some intermittent flank pain during this time. She also reports vaginal discharge. No vaginal itching. No foul odor.  Past Medical History  Diagnosis Date  . Constipation    Past Surgical History  Procedure Laterality Date  . Hemorroid removal     No family history on file. History  Substance Use Topics  . Smoking status: Current Every Day Smoker    Types: Pipe  . Smokeless tobacco: Current User  . Alcohol Use: No   OB History    No data available     Review of Systems  Constitutional: Negative for fever and chills.  HENT: Positive for postnasal drip and sore throat. Negative for congestion, rhinorrhea and trouble swallowing.   Respiratory: Positive for cough. Negative for shortness of breath.   Gastrointestinal: Negative for nausea, vomiting, abdominal pain and diarrhea.  Genitourinary: Positive for dysuria, urgency, frequency, flank pain and vaginal discharge.    Allergies  Review of patient's allergies indicates no known allergies.  Home Medications   Prior to Admission medications   Medication Sig Start Date End Date Taking? Authorizing Provider  amoxicillin (AMOXIL) 500 MG capsule Take 1 capsule (500 mg total) by mouth 2 (two) times daily. 12/12/14   Melony Overly, MD  nitrofurantoin, macrocrystal-monohydrate,  (MACROBID) 100 MG capsule Take 1 capsule (100 mg total) by mouth 2 (two) times daily. 05/05/14   Carmin Muskrat, MD  ondansetron (ZOFRAN ODT) 4 MG disintegrating tablet Take 1 tablet (4 mg total) by mouth every 8 (eight) hours as needed for nausea or vomiting. 05/05/14   Carmin Muskrat, MD  promethazine (PHENERGAN) 25 MG tablet Take 1 tablet (25 mg total) by mouth every 6 (six) hours as needed for nausea or vomiting. 05/06/14   Antonietta Breach, PA-C   BP 111/75 mmHg  Pulse 83  Temp(Src) 97.5 F (36.4 C) (Oral)  Resp 14  SpO2 98%  LMP 12/12/2014 Physical Exam  Constitutional: She is oriented to person, place, and time. She appears well-developed and well-nourished. No distress.  HENT:  Mouth/Throat: Mucous membranes are normal. Oropharyngeal exudate and posterior oropharyngeal erythema present.  Neck: Neck supple.  Cardiovascular: Normal rate, regular rhythm and normal heart sounds.   No murmur heard. Pulmonary/Chest: Effort normal and breath sounds normal. No respiratory distress. She has no wheezes. She has no rales.  Genitourinary: There is no rash on the right labia. There is no rash on the left labia. There is bleeding (she is on her period) in the vagina. No foreign body around the vagina. No signs of injury around the vagina. No vaginal discharge found.  Lymphadenopathy:    She has no cervical adenopathy.  Neurological: She is alert and oriented to person, place, and time.    ED Course  Procedures (including critical care time) Labs Review Labs Reviewed  POCT URINALYSIS DIP (DEVICE) - Abnormal; Notable for the following:  Urobilinogen, UA 2.0 (*)    All other components within normal limits  CULTURE, GROUP A STREP  URINE CULTURE  POCT RAPID STREP A ([facility] ONLY)  POCT PREGNANCY, URINE  CERVICOVAGINAL ANCILLARY ONLY    Imaging Review No results found.   MDM   1. Pharyngitis    Rapid strep is negative, but clinically she has strep throat. We'll treat with  amoxicillin.  Vaginal swab sent for wet prep and gonorrhea/chlamydia. UA is unremarkable. Urine has been sent for culture. We'll treat based on culture results.  Follow-up as needed.    Melony Overly, MD 12/12/14 1524

## 2014-12-12 NOTE — ED Notes (Signed)
Call back number verified.

## 2014-12-12 NOTE — Discharge Instructions (Signed)
You have strep throat. Take amoxicillin twice a day for 10 days.  I sent your urine for culture. We also collected vaginal swabs. We will call if anything is positive.  Follow-up as needed.

## 2014-12-14 LAB — URINE CULTURE
Colony Count: NO GROWTH
Culture: NO GROWTH

## 2014-12-14 LAB — CULTURE, GROUP A STREP

## 2014-12-15 LAB — CERVICOVAGINAL ANCILLARY ONLY
CHLAMYDIA, DNA PROBE: NEGATIVE
NEISSERIA GONORRHEA: NEGATIVE
WET PREP (BD AFFIRM): NEGATIVE
Wet Prep (BD Affirm): NEGATIVE
Wet Prep (BD Affirm): NEGATIVE

## 2014-12-17 ENCOUNTER — Telehealth (HOSPITAL_COMMUNITY): Payer: Self-pay | Admitting: *Deleted

## 2014-12-17 NOTE — ED Notes (Signed)
GC/Chlamydia and Affirm all neg., Urine culture: No growth, Throat culture: Strep beta hemolytic not Group A.  I called pt. Pt. verified x 2 and given results.  Pt. told she was adequately treated with Amoxicillin and to finish all of medication. If not better to get rechecked. Roselyn Meier 12/17/2014

## 2015-05-14 ENCOUNTER — Other Ambulatory Visit (HOSPITAL_COMMUNITY)
Admission: RE | Admit: 2015-05-14 | Discharge: 2015-05-14 | Disposition: A | Discharge status: Home / Self Care | Payer: No Typology Code available for payment source | Source: Ambulatory Visit | Attending: Family Medicine | Admitting: Family Medicine

## 2015-05-14 ENCOUNTER — Encounter (HOSPITAL_COMMUNITY): Payer: Self-pay | Admitting: Emergency Medicine

## 2015-05-14 ENCOUNTER — Emergency Department (HOSPITAL_COMMUNITY)
Admission: EM | Admit: 2015-05-14 | Discharge: 2015-05-14 | Disposition: A | Discharge status: Home / Self Care | Payer: No Typology Code available for payment source | Source: Home / Self Care | Attending: Family Medicine | Admitting: Family Medicine

## 2015-05-14 DIAGNOSIS — R350 Frequency of micturition: Secondary | ICD-10-CM

## 2015-05-14 DIAGNOSIS — Z113 Encounter for screening for infections with a predominantly sexual mode of transmission: Secondary | ICD-10-CM | POA: Insufficient documentation

## 2015-05-14 DIAGNOSIS — B9689 Other specified bacterial agents as the cause of diseases classified elsewhere: Secondary | ICD-10-CM

## 2015-05-14 DIAGNOSIS — N9489 Other specified conditions associated with female genital organs and menstrual cycle: Secondary | ICD-10-CM | POA: Diagnosis not present

## 2015-05-14 DIAGNOSIS — N898 Other specified noninflammatory disorders of vagina: Secondary | ICD-10-CM

## 2015-05-14 DIAGNOSIS — A499 Bacterial infection, unspecified: Secondary | ICD-10-CM

## 2015-05-14 DIAGNOSIS — R102 Pelvic and perineal pain: Secondary | ICD-10-CM

## 2015-05-14 DIAGNOSIS — N760 Acute vaginitis: Secondary | ICD-10-CM

## 2015-05-14 LAB — POCT URINALYSIS DIP (DEVICE)
Glucose, UA: 100 mg/dL — AB
Hgb urine dipstick: NEGATIVE
Ketones, ur: NEGATIVE mg/dL
Leukocytes, UA: NEGATIVE
NITRITE: NEGATIVE
PROTEIN: 30 mg/dL — AB
UROBILINOGEN UA: 0.2 mg/dL (ref 0.0–1.0)
pH: 6 (ref 5.0–8.0)

## 2015-05-14 LAB — POCT PREGNANCY, URINE: Preg Test, Ur: NEGATIVE

## 2015-05-14 MED ORDER — METRONIDAZOLE 500 MG PO TABS: 500.0000 mg | ORAL_TABLET | Freq: Two times a day (BID) | ORAL | Status: DC

## 2015-05-14 NOTE — Discharge Instructions (Signed)
Antibiotic Medication Antibiotics are among the most frequently prescribed medicines. Antibiotics cure illness by assisting our body to injure or kill the bacteria that cause infection. While antibiotics are useful to treat a wide variety of infections they are useless against viruses. Antibiotics cannot cure colds, flu, or other viral infections.  There are many types of antibiotics available. Your caregiver will decide which antibiotic will be useful for an illness. Never take or give someone else's antibiotics or left over medicine. Your caregiver may also take into account:  Allergies.  The cost of the medicine.  Dosing schedules.  Taste.  Common side effects when choosing an antibiotic for an infection. Ask your caregiver if you have questions about why a certain medicine was chosen. HOME CARE INSTRUCTIONS Read all instructions and labels on medicine bottles carefully. Some antibiotics should be taken on an empty stomach while others should be taken with food. Taking antibiotics incorrectly may reduce how well they work. Some antibiotics need to be kept in the refrigerator. Others should be kept at room temperature. Ask your caregiver or pharmacist if you do not understand how to give the medicine. Be sure to give the amount of medicine your caregiver has prescribed. Even if you feel better and your symptoms improve, bacteria may still remain alive in the body. Taking all of the medicine will prevent:  The infection from returning and becoming harder to treat.  Complications from partially treated infections. If there is any medicine left over after you have taken the medicine as your caregiver has instructed, throw the medicine away. Be sure to tell your caregiver if you:  Are allergic to any medicines.  Are pregnant or intend to become pregnant while using this medicine.  Are breastfeeding.  Are taking any other prescription, non-prescription medicine, or herbal  remedies.  Have any other medical conditions or problems you have not already discussed. If you are taking birth control pills, they may not work while you are on antibiotics. To avoid unwanted pregnancy:  Continue taking your birth control pills as usual.  Use a second form of birth control (such as condoms) while you are taking antibiotic medicine.  When you finish taking the antibiotic medicine, continue using the second form of birth control until you are finished with your current 1 month cycle of birth control pills. Try not to miss any doses of medicine. If you miss a dose, take it as soon as possible. However, if it is almost time for the next dose and the dosing schedule is:  2 doses a day, take the missed dose and the next dose 5 to 6 hours apart.  3 or more doses a day, take the missed dose and the next dose 2 to 4 hours apart, then go back to the normal schedule.  If you are unable to make up a missed dose, take the next scheduled dose on time and complete the missed dose at the end of the prescribed time for your medicine. SIDE EFFECTS TO TAKING ANTIBIOTICS Common side effects to antibiotic use include:  Soft stools or diarrhea.  Mild stomach upset.  Sun sensitivity. SEEK MEDICAL CARE IF:   If you get worse or do not improve within a few days of starting the medicine.  Vomiting develops.  Diaper rash or rash on the genitals appears.  Vaginal itching occurs.  White patches appear on the tongue or in the mouth.  Severe watery diarrhea and abdominal cramps occur.  Signs of an allergy develop (hives, unknown  itchy rash appears). STOP TAKING THE ANTIBIOTIC. SEEK IMMEDIATE MEDICAL CARE IF:   Urine turns dark or blood colored.  Skin turns yellow.  Easy bruising or bleeding occurs.  Joint pain or muscle aches occur.  Fever returns.  Severe headache occurs.  Signs of an allergy develop (trouble breathing, wheezing, swelling of the lips, face or tongue,  fainting, or blisters on the skin or in the mouth). STOP TAKING THE ANTIBIOTIC. Document Released: 06/01/2004 Document Revised: 12/12/2011 Document Reviewed: 06/11/2009 [employer] [facility] Patient Information 2015 [place], [place]. This information is not intended to replace advice given to you by your health care provider. Make sure you discuss any questions you have with your health care provider.  Bacterial Vaginosis Bacterial vaginosis is a vaginal infection that occurs when the normal balance of bacteria in the vagina is disrupted. It results from an overgrowth of certain bacteria. This is the most common vaginal infection in women of childbearing age. Treatment is important to prevent complications, especially in pregnant women, as it can cause a premature delivery. CAUSES  Bacterial vaginosis is caused by an increase in harmful bacteria that are normally present in smaller amounts in the vagina. Several different kinds of bacteria can cause bacterial vaginosis. However, the reason that the condition develops is not fully understood. RISK FACTORS Certain activities or behaviors can put you at an increased risk of developing bacterial vaginosis, including:  Having a new sex partner or multiple sex partners.  Douching.  Using an intrauterine device (IUD) for contraception. Women do not get bacterial vaginosis from toilet seats, bedding, swimming pools, or contact with objects around them. SIGNS AND SYMPTOMS  Some women with bacterial vaginosis have no signs or symptoms. Common symptoms include:  Grey vaginal discharge.  A fishlike odor with discharge, especially after sexual intercourse.  Itching or burning of the vagina and vulva.  Burning or pain with urination. DIAGNOSIS  Your health care provider will take a medical history and examine the vagina for signs of bacterial vaginosis. A sample of vaginal fluid may be taken. Your health care provider will look at this sample under a microscope to  check for bacteria and abnormal cells. A vaginal pH test may also be done.  TREATMENT  Bacterial vaginosis may be treated with antibiotic medicines. These may be given in the form of a pill or a vaginal cream. A second round of antibiotics may be prescribed if the condition comes back after treatment.  HOME CARE INSTRUCTIONS   Only take over-the-counter or prescription medicines as directed by your health care provider.  If antibiotic medicine was prescribed, take it as directed. Make sure you finish it even if you start to feel better.  Do not have sex until treatment is completed.  Tell all sexual partners that you have a vaginal infection. They should see their health care provider and be treated if they have problems, such as a mild rash or itching.  Practice safe sex by using condoms and only having one sex partner. SEEK MEDICAL CARE IF:   Your symptoms are not improving after 3 days of treatment.  You have increased discharge or pain.  You have a fever. MAKE SURE YOU:   Understand these instructions.  Will watch your condition.  Will get help right away if you are not doing well or get worse. FOR MORE INFORMATION  Centers for Disease Control and Prevention, Division of STD Prevention: [employer] American Sexual Health Association (ASHA): www.ashastd.org  Document Released: 09/19/2005 Document Revised: 07/10/2013 Document Reviewed: 05/01/2013  ExitCare Patient Information 2015 [place]. This information is not intended to replace advice given to you by your health care provider. Make sure you discuss any questions you have with your health care provider.  Urinary Frequency The number of times a normal person urinates depends upon how much liquid they take in and how much liquid they are losing. If the temperature is hot and there is high humidity, then the person will sweat more and usually breathe a little more frequently. These factors decrease the amount of  frequency of urination that would be considered normal. The amount you drink is easily determined, but the amount of fluid lost is sometimes more difficult to calculate.  Fluid is lost in two ways:  Sensible fluid loss is usually measured by the amount of urine that you get rid of. Losses of fluid can also occur with diarrhea.  Insensible fluid loss is more difficult to measure. It is caused by evaporation. Insensible loss of fluid occurs through breathing and sweating. It usually ranges from a little less than a quart to a little more than a quart of fluid a day. In normal temperatures and activity levels, the average person may urinate 4 to 7 times in a 24-hour period. Needing to urinate more often than that could indicate a problem. If one urinates 4 to 7 times in 24 hours and has large volumes each time, that could indicate a different problem from one who urinates 4 to 7 times a day and has small volumes. The time of urinating is also important. Most urinating should be done during the waking hours. Getting up at night to urinate frequently can indicate some problems. CAUSES  The bladder is the organ in your lower abdomen that holds urine. Like a balloon, it swells some as it fills up. Your nerves sense this and tell you it is time to head for the bathroom. There are a number of reasons that you might feel the need to urinate more often than usual. They include:  Urinary tract infection. This is usually associated with other signs such as burning when you urinate.  In men, problems with the prostate (a walnut-size gland that is located near the tube that carries urine out of your body). There are two reasons why the prostate can cause an increased frequency of urination:  An enlarged prostate that does not let the bladder empty well. If the bladder only half empties when you urinate, then it only has half the capacity to fill before you have to urinate again.  The nerves in the bladder become  more hypersensitive with an increased size of the prostate even if the bladder empties completely.  Pregnancy.  Obesity. Excess weight is more likely to cause a problem for women than for men.  Bladder stones or other bladder problems.  Caffeine.  Alcohol.  Medications. For example, drugs that help the body get rid of extra fluid (diuretics) increase urine production. Some other medicines must be taken with lots of fluids.  Muscle or nerve weakness. This might be the result of a spinal cord injury, a stroke, multiple sclerosis, or Parkinson disease.  Long-standing diabetes can decrease the sensation of the bladder. This loss of sensation makes it harder to sense the bladder needs to be emptied. Over a period of years, the bladder is stretched out by constant overfilling. This weakens the bladder muscles so that the bladder does not empty well and has less capacity to fill with new urine.  Interstitial  cystitis (also called painful bladder syndrome). This condition develops because the tissues that line the inside of the bladder are inflamed (inflammation is the body's way of reacting to injury or infection). It causes pain and frequent urination. It occurs in women more often than in men. DIAGNOSIS   To decide what might be causing your urinary frequency, your health care provider will probably:  Ask about symptoms you have noticed.  Ask about your overall health. This will include questions about any medications you are taking.  Do a physical examination.  Order some tests. These might include:  A blood test to check for diabetes or other health issues that could be contributing to the problem.  Urine testing. This could measure the flow of urine and the pressure on the bladder.  A test of your neurological system (the brain, spinal cord, and nerves). This is the system that senses the need to urinate.  A bladder test to check whether it is emptying completely when you  urinate.  Cystoscopy. This test uses a thin tube with a tiny camera on it. It offers a look inside your urethra and bladder to see if there are problems.  Imaging tests. You might be given a contrast dye and then asked to urinate. X-rays are taken to see how your bladder is working. TREATMENT  It is important for you to be evaluated to determine if the amount or frequency that you have is unusual or abnormal. If it is found to be abnormal, the cause should be determined and this can usually be found out easily. Depending upon the cause, treatment could include medication, stimulation of the nerves, or surgery. There are not too many things that you can do as an individual to change your urinary frequency. It is important that you balance the amount of fluid intake needed to compensate for your activity and the temperature. Medical problems will be diagnosed and taken care of by your physician. There is no particular bladder training such as Kegel exercises that you can do to help urinary frequency. This is an exercise that is usually recommended for people who have leaking of urine when they laugh, cough, or sneeze. HOME CARE INSTRUCTIONS   Take any medications your health care provider prescribed or suggested. Follow the directions carefully.  Practice any lifestyle changes that are recommended. These might include:  Drinking less fluid or drinking at different times of the day. If you need to urinate often during the night, for example, you may need to stop drinking fluids early in the evening.  Cutting down on caffeine or alcohol. They both can make you need to urinate more often than normal. Caffeine is found in coffee, tea, and sodas.  Losing weight, if that is recommended.  Keep a journal or a log. You might be asked to record how much you drink and when and where you feel the need to urinate. This will also help evaluate how well the treatment provided by your physician is working. SEEK  MEDICAL CARE IF:   Your need to urinate often gets worse.  You feel increased pain or irritation when you urinate.  You notice blood in your urine.  You have questions about any medications that your health care provider recommended.  You notice blood, pus, or swelling at the site of any test or treatment procedure.  You develop a fever of more than 100.5F (38.1C). SEEK IMMEDIATE MEDICAL CARE IF:  You develop a fever of more than 102.67F (38.9C). Document  Released: 07/16/2009 Document Revised: 02/03/2014 Document Reviewed: 07/16/2009 [employer] Patient Information 2015 [place], [place]. This information is not intended to replace advice given to you by your health care provider. Make sure you discuss any questions you have with your health care provider.  Vaginitis Vaginitis is an inflammation of the vagina. It can happen when the normal bacteria and yeast in the vagina grow too much. There are different types. Treatment will depend on the type you have. HOME CARE  Take all medicines as told by your doctor.  Keep your vagina area clean and dry. Avoid soap. Rinse the area with water.  Avoid washing and cleaning out the vagina (douching).  Do not use tampons or have sex (intercourse) until your treatment is done.  Wipe from front to back after going to the restroom.  Wear cotton underwear.  Avoid wearing underwear while you sleep until your vaginitis is gone.  Avoid tight pants. Avoid underwear or nylons without a cotton panel.  Take off wet clothing (such as a bathing suit) as soon as you can.  Use mild, unscented products. Avoid fabric softeners and scented:  Feminine sprays.  Laundry detergents.  Tampons.  Soaps or bubble baths.  Practice safe sex and use condoms. GET HELP RIGHT AWAY IF:   You have belly (abdominal) pain.  You have a fever or lasting symptoms for more than 2-3 days.  You have a fever and your symptoms suddenly get worse. MAKE SURE YOU:    Understand these instructions.  Will watch this condition.  Will get help right away if you are not doing well or get worse. Document Released: 12/16/2008 Document Revised: 06/13/2012 Document Reviewed: 03/01/2012 [facility] Patient Information 2015 [place], [place]. This information is not intended to replace advice given to you by your health care provider. Make sure you discuss any questions you have with your health care provider.

## 2015-05-14 NOTE — ED Notes (Signed)
C/o uti States she has pressure when urinating Was taking antibiotics for BV two weeks ago

## 2015-05-14 NOTE — ED Provider Notes (Signed)
CSN: [id number]     Arrival date & time 05/14/15  1534 History   First MD Initiated Contact with Patient 05/14/15 1547     Chief Complaint  Patient presents with  . Urinary Tract Infection   (Consider location/radiation/quality/duration/timing/severity/associated sxs/prior Treatment) HPI Comments: 19 year old female complaining of pelvic pain described as a pressure feeling associated with urination for 6 days. She has occasional urinary frequency but her stream is normal. She also states that she has a vaginal discharge for 1 month. She was treated earlier with Flagyl but did not take the full course. Occasionally has nausea but no vomiting. LMP was "early July 2016.   Past Medical History  Diagnosis Date  . Constipation    Past Surgical History  Procedure Laterality Date  . Hemorroid removal     History reviewed. No pertinent family history. Social History  Substance Use Topics  . Smoking status: Current Every Day Smoker    Types: Pipe  . Smokeless tobacco: Current User  . Alcohol Use: No   OB History    No data available     Review of Systems  Constitutional: Negative.   Respiratory: Negative.   Gastrointestinal: Negative.   Genitourinary: Positive for frequency, vaginal discharge and pelvic pain. Negative for flank pain, vaginal bleeding and menstrual problem.       See HPI   Skin: Negative for rash.  Neurological: Negative.   Psychiatric/Behavioral: Negative.     Allergies  Review of patient's allergies indicates no known allergies.  Home Medications   Prior to Admission medications   Medication Sig Start Date End Date Taking? Authorizing Provider  metroNIDAZOLE (FLAGYL) 500 MG tablet Take 1 tablet (500 mg total) by mouth 2 (two) times daily. X 7 days 05/14/15   Janne Napoleon, NP  nitrofurantoin, macrocrystal-monohydrate, (MACROBID) 100 MG capsule Take 1 capsule (100 mg total) by mouth 2 (two) times daily. 05/05/14   Carmin Muskrat, MD  ondansetron (ZOFRAN ODT) 4  MG disintegrating tablet Take 1 tablet (4 mg total) by mouth every 8 (eight) hours as needed for nausea or vomiting. 05/05/14   Carmin Muskrat, MD  promethazine (PHENERGAN) 25 MG tablet Take 1 tablet (25 mg total) by mouth every 6 (six) hours as needed for nausea or vomiting. 05/06/14   Antonietta Breach, PA-C   BP 133/82 mmHg  Pulse 89  Temp(Src) 98.2 F (36.8 C) (Oral)  Resp 16  SpO2 98%  LMP 04/03/2015 Physical Exam  Constitutional: She is oriented to person, place, and time. She appears well-developed and well-nourished. No distress.  Neck: Normal range of motion. Neck supple.  Cardiovascular: Normal rate.   Pulmonary/Chest: Effort normal.  Abdominal: Soft. Bowel sounds are normal. She exhibits no distension and no mass. There is no tenderness. There is no rebound and no guarding.  Genitourinary:  NEFG Small amt of thick white D/C prox vagina and coating cervix Cx midline, pink, no lesions No CMT or adnexal tenderness.  Musculoskeletal: She exhibits no edema or tenderness.  Neurological: She is alert and oriented to person, place, and time. She exhibits normal muscle tone.  Skin: Skin is dry.  Psychiatric: She has a normal mood and affect.  Nursing note and vitals reviewed.   ED Course  Procedures (including critical care time) Labs Review Labs Reviewed  POCT URINALYSIS DIP (DEVICE) - Abnormal; Notable for the following:    Glucose, UA 100 (*)    Bilirubin Urine SMALL (*)    Protein, ur 30 (*)    All other components  within normal limits  POCT PREGNANCY, URINE  CERVICOVAGINAL ANCILLARY ONLY   Results for orders placed or performed during the hospital encounter of 05/14/15  POCT urinalysis dip (device)  Result Value Ref Range   Glucose, UA 100 (A) NEGATIVE mg/dL   Bilirubin Urine SMALL (A) NEGATIVE   Ketones, ur NEGATIVE NEGATIVE mg/dL   Specific Gravity, Urine >=1.030 1.005 - 1.030   Hgb urine dipstick NEGATIVE NEGATIVE   pH 6.0 5.0 - 8.0   Protein, ur 30 (A) NEGATIVE  mg/dL   Urobilinogen, UA 0.2 0.0 - 1.0 mg/dL   Nitrite NEGATIVE NEGATIVE   Leukocytes, UA NEGATIVE NEGATIVE  Pregnancy, urine POC  Result Value Ref Range   Preg Test, Ur NEGATIVE NEGATIVE     Imaging Review No results found.   MDM   1. Urinary frequency   2. Vaginal discharge   3. BV (bacterial vaginosis)   4. Pelvic pressure in female     Preg test neg. U/a not suggestive of infection, will wait pending culture Mild vag d/c probably persistent BV and will tx with full course of flagyl and await cervical cytology.No pelvic tenderness. [place],MA present during exam   Janne Napoleon, NP 05/14/15 1624

## 2015-05-15 LAB — CERVICOVAGINAL ANCILLARY ONLY
CHLAMYDIA, DNA PROBE: NEGATIVE
Neisseria Gonorrhea: NEGATIVE

## 2015-05-15 NOTE — ED Notes (Signed)
GC/chlamydia negative. Still waiting for wet prep report

## 2015-05-18 LAB — CERVICOVAGINAL ANCILLARY ONLY: Wet Prep (BD Affirm): NEGATIVE

## 2015-05-18 NOTE — ED Notes (Signed)
Called patient cell phone and left message regarding negative lab reports

## 2015-08-03 ENCOUNTER — Emergency Department (HOSPITAL_COMMUNITY): Payer: No Typology Code available for payment source

## 2015-08-03 ENCOUNTER — Encounter (HOSPITAL_COMMUNITY): Payer: Self-pay | Admitting: Emergency Medicine

## 2015-08-03 ENCOUNTER — Emergency Department (HOSPITAL_COMMUNITY)
Admission: EM | Admit: 2015-08-03 | Discharge: 2015-08-03 | Disposition: A | Discharge status: Home / Self Care | Payer: No Typology Code available for payment source | Attending: Emergency Medicine | Admitting: Emergency Medicine

## 2015-08-03 DIAGNOSIS — S90811A Abrasion, right foot, initial encounter: Secondary | ICD-10-CM

## 2015-08-03 DIAGNOSIS — Z79899 Other long term (current) drug therapy: Secondary | ICD-10-CM | POA: Insufficient documentation

## 2015-08-03 DIAGNOSIS — S199XXA Unspecified injury of neck, initial encounter: Secondary | ICD-10-CM | POA: Diagnosis not present

## 2015-08-03 DIAGNOSIS — Z8719 Personal history of other diseases of the digestive system: Secondary | ICD-10-CM | POA: Insufficient documentation

## 2015-08-03 DIAGNOSIS — Z23 Encounter for immunization: Secondary | ICD-10-CM | POA: Diagnosis not present

## 2015-08-03 DIAGNOSIS — S9031XA Contusion of right foot, initial encounter: Secondary | ICD-10-CM | POA: Insufficient documentation

## 2015-08-03 DIAGNOSIS — Y998 Other external cause status: Secondary | ICD-10-CM | POA: Insufficient documentation

## 2015-08-03 DIAGNOSIS — M79671 Pain in right foot: Secondary | ICD-10-CM

## 2015-08-03 DIAGNOSIS — Z792 Long term (current) use of antibiotics: Secondary | ICD-10-CM | POA: Diagnosis not present

## 2015-08-03 DIAGNOSIS — Y9389 Activity, other specified: Secondary | ICD-10-CM | POA: Diagnosis not present

## 2015-08-03 DIAGNOSIS — S99921A Unspecified injury of right foot, initial encounter: Secondary | ICD-10-CM | POA: Diagnosis present

## 2015-08-03 DIAGNOSIS — M542 Cervicalgia: Secondary | ICD-10-CM

## 2015-08-03 DIAGNOSIS — Y92410 Unspecified street and highway as the place of occurrence of the external cause: Secondary | ICD-10-CM | POA: Insufficient documentation

## 2015-08-03 DIAGNOSIS — M62838 Other muscle spasm: Secondary | ICD-10-CM | POA: Insufficient documentation

## 2015-08-03 DIAGNOSIS — Z720 Tobacco use: Secondary | ICD-10-CM | POA: Diagnosis not present

## 2015-08-03 MED ORDER — NAPROXEN 500 MG PO TABS: 500.0000 mg | ORAL_TABLET | Freq: Two times a day (BID) | ORAL | Status: DC | PRN

## 2015-08-03 MED ORDER — HYDROCODONE-ACETAMINOPHEN 5-325 MG PO TABS
1.0000 | ORAL_TABLET | Freq: Once | ORAL | Status: AC
  Administered 2015-08-03: 1 via ORAL
  Filled 2015-08-03: qty 1

## 2015-08-03 MED ORDER — CYCLOBENZAPRINE HCL 10 MG PO TABS: 10.0000 mg | ORAL_TABLET | Freq: Three times a day (TID) | ORAL | Status: DC | PRN

## 2015-08-03 MED ORDER — HYDROCODONE-ACETAMINOPHEN 5-325 MG PO TABS: 1.0000 | ORAL_TABLET | Freq: Four times a day (QID) | ORAL | Status: DC | PRN

## 2015-08-03 MED ORDER — TETANUS-DIPHTH-ACELL PERTUSSIS 5-2.5-18.5 LF-MCG/0.5 IM SUSP
0.5000 mL | Freq: Once | INTRAMUSCULAR | Status: AC
  Administered 2015-08-03: 0.5 mL via INTRAMUSCULAR
  Filled 2015-08-03: qty 0.5
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
## 2015-08-10 ENCOUNTER — Encounter (HOSPITAL_COMMUNITY): Payer: Self-pay | Admitting: Emergency Medicine

## 2015-08-10 ENCOUNTER — Emergency Department (HOSPITAL_COMMUNITY)
Admission: EM | Admit: 2015-08-10 | Discharge: 2015-08-10 | Discharge status: Against Medical Advice | Payer: No Typology Code available for payment source | Attending: Emergency Medicine | Admitting: Emergency Medicine

## 2015-08-10 DIAGNOSIS — Z720 Tobacco use: Secondary | ICD-10-CM | POA: Insufficient documentation

## 2015-08-10 DIAGNOSIS — K649 Unspecified hemorrhoids: Secondary | ICD-10-CM | POA: Insufficient documentation

## 2015-08-10 HISTORY — DX: Unspecified hemorrhoids: K64.9

## 2015-08-10 NOTE — ED Notes (Signed)
Pt. reports painful hemorrhoids with mild bleeding onset yesterday .

## 2015-08-10 NOTE — ED Notes (Signed)
Pt called three times for room placement with no answer.

## 2015-08-10 NOTE — ED Provider Notes (Signed)
Blood pressure 101/70, pulse 109, temperature 98.2 F (36.8 C), temperature source Oral, resp. rate 16, height 5\' 10"  (1.778 m), weight 164 lb (74.39 kg), last menstrual period 07/28/2015, SpO2 98 %.  Gabrielle Brooks is a 19 y.o. female with chief complaint of hemorrhoids. Patient left without being seen after triage. I did not participate in the care of this patient.   Monico Blitz, PA-C 08/10/15 [place], MD 08/13/15 2155

## 2015-08-11 ENCOUNTER — Encounter (HOSPITAL_COMMUNITY): Payer: Self-pay | Admitting: *Deleted

## 2015-08-11 ENCOUNTER — Emergency Department (HOSPITAL_COMMUNITY)
Admission: EM | Admit: 2015-08-11 | Discharge: 2015-08-11 | Disposition: A | Discharge status: Home / Self Care | Payer: No Typology Code available for payment source | Attending: Emergency Medicine | Admitting: Emergency Medicine

## 2015-08-11 DIAGNOSIS — Z79899 Other long term (current) drug therapy: Secondary | ICD-10-CM | POA: Insufficient documentation

## 2015-08-11 DIAGNOSIS — K625 Hemorrhage of anus and rectum: Secondary | ICD-10-CM | POA: Diagnosis present

## 2015-08-11 DIAGNOSIS — Z792 Long term (current) use of antibiotics: Secondary | ICD-10-CM | POA: Insufficient documentation

## 2015-08-11 DIAGNOSIS — K5900 Constipation, unspecified: Secondary | ICD-10-CM | POA: Diagnosis not present

## 2015-08-11 DIAGNOSIS — K648 Other hemorrhoids: Secondary | ICD-10-CM | POA: Insufficient documentation

## 2015-08-11 DIAGNOSIS — Z720 Tobacco use: Secondary | ICD-10-CM | POA: Insufficient documentation

## 2015-08-11 DIAGNOSIS — Z7952 Long term (current) use of systemic steroids: Secondary | ICD-10-CM | POA: Insufficient documentation

## 2015-08-11 LAB — I-STAT CHEM 8, ED
BUN: 18 mg/dL (ref 6–20)
CHLORIDE: 105 mmol/L (ref 101–111)
CREATININE: 0.6 mg/dL (ref 0.44–1.00)
Calcium, Ion: 1.21 mmol/L (ref 1.12–1.23)
GLUCOSE: 84 mg/dL (ref 65–99)
HCT: 38 % (ref 36.0–46.0)
Hemoglobin: 12.9 g/dL (ref 12.0–15.0)
POTASSIUM: 4.2 mmol/L (ref 3.5–5.1)
Sodium: 142 mmol/L (ref 135–145)
TCO2: 26 mmol/L (ref 0–100)

## 2015-08-11 LAB — POC OCCULT BLOOD, ED: Fecal Occult Bld: POSITIVE — AB

## 2015-08-11 MED ORDER — HYDROCORTISONE 2.5 % RE CREA: TOPICAL_CREAM | RECTAL | Status: DC

## 2015-08-11 MED ORDER — POLYETHYLENE GLYCOL 3350 17 G PO PACK: 17.0000 g | PACK | Freq: Every day | ORAL | Status: DC

## 2015-08-11 MED ORDER — DOCUSATE SODIUM 100 MG PO CAPS: 100.0000 mg | ORAL_CAPSULE | Freq: Two times a day (BID) | ORAL | Status: DC

## 2015-08-11 MED ORDER — HYDROCORTISONE ACETATE 25 MG RE SUPP: 25.0000 mg | Freq: Two times a day (BID) | RECTAL | Status: DC

## 2015-08-11 MED ORDER — DOCUSATE SODIUM 100 MG PO CAPS
100.0000 mg | ORAL_CAPSULE | Freq: Once | ORAL | Status: AC
  Administered 2015-08-11: 100 mg via ORAL
  Filled 2015-08-11: qty 1

## 2015-08-11 MED ORDER — HYDROCORTISONE ACETATE 25 MG RE SUPP
25.0000 mg | Freq: Once | RECTAL | Status: AC
  Administered 2015-08-11: 25 mg via RECTAL
  Filled 2015-08-11: qty 1

## 2015-08-11 NOTE — Discharge Instructions (Signed)
You have been seen today for hemorrhoids. Your lab tests showed no abnormalities. Follow up with PCP tomorrow, as scheduled, and for chronic management of this issue. Return to ED should symptoms worsen. Do not take any more hydrocodone or other narcotics as they will make you more constipated.

## 2015-08-11 NOTE — ED Provider Notes (Signed)
CSN: [id number]     Arrival date & time 08/11/15  7846 History   First MD Initiated Contact with Patient 08/11/15 0857     Chief Complaint  Patient presents with  . Rectal Bleeding     (Consider location/radiation/quality/duration/timing/severity/associated sxs/prior Treatment) HPI   Gabrielle Brooks is a 19 y.o. female, with a history of hemorrhoids and surgical hemorrhoid removal, presenting to the ED with rectal pain beginning 2 days ago. Describes pain as intermittent pressure, 10/10, non-radiating. Pt adds she had some scant bright red blood on the toilet paper yesterday. Denies N/V/D, abdominal pain, urinary symptoms, or any other complaints. Pt has an appointment with her PCP for this issue tomorrow, but patient states her pain was too bad to wait. Last BM was 2 days ago. Pt has tried hydrocodone, but without pain relief. Pt was in this ED yesterday for the same problem, but left after triage and before being seen by a provider.    Past Medical History  Diagnosis Date  . Constipation   . Hemorrhoid    Past Surgical History  Procedure Laterality Date  . Hemorroid removal    . Hernia repair    . Nose surgery     No family history on file. Social History  Substance Use Topics  . Smoking status: Current Every Day Smoker    Types: Pipe  . Smokeless tobacco: Current User  . Alcohol Use: No   OB History    No data available     Review of Systems  Gastrointestinal: Positive for anal bleeding and rectal pain.  All other systems reviewed and are negative.     Allergies  Review of patient's allergies indicates no known allergies.  Home Medications   Prior to Admission medications   Medication Sig Start Date End Date Taking? Authorizing Provider  cyclobenzaprine (FLEXERIL) 10 MG tablet Take 1 tablet (10 mg total) by mouth 3 (three) times daily as needed for muscle spasms. 08/03/15   Mercedes Camprubi-Soms, PA-C  docusate sodium (COLACE) 100 MG capsule Take 1 capsule (100  mg total) by mouth every 12 (twelve) hours. 08/11/15   Merric Yost C Tracer Gutridge, PA-C  HYDROcodone-acetaminophen (NORCO) 5-325 MG tablet Take 1 tablet by mouth every 6 (six) hours as needed for severe pain. 08/03/15   Mercedes Camprubi-Soms, PA-C  hydrocortisone (ANUCORT-HC) 25 MG suppository Place 1 suppository (25 mg total) rectally 2 (two) times daily. 08/11/15   Elisse Pennick C Nyomie Ehrlich, PA-C  metroNIDAZOLE (FLAGYL) 500 MG tablet Take 1 tablet (500 mg total) by mouth 2 (two) times daily. X 7 days 05/14/15   Janne Napoleon, NP  naproxen (NAPROSYN) 500 MG tablet Take 1 tablet (500 mg total) by mouth 2 (two) times daily as needed for mild pain, moderate pain or headache (TAKE WITH MEALS.). 08/03/15   Mercedes Camprubi-Soms, PA-C  nitrofurantoin, macrocrystal-monohydrate, (MACROBID) 100 MG capsule Take 1 capsule (100 mg total) by mouth 2 (two) times daily. 05/05/14   Carmin Muskrat, MD  ondansetron (ZOFRAN ODT) 4 MG disintegrating tablet Take 1 tablet (4 mg total) by mouth every 8 (eight) hours as needed for nausea or vomiting. 05/05/14   Carmin Muskrat, MD  polyethylene glycol [facility]) packet Take 17 g by mouth daily. 08/11/15   Cathaleen Korol C Ady Heimann, PA-C  promethazine (PHENERGAN) 25 MG tablet Take 1 tablet (25 mg total) by mouth every 6 (six) hours as needed for nausea or vomiting. 05/06/14   Antonietta Breach, PA-C   BP 105/64 mmHg  Pulse 64  Temp(Src) 98.8 F (37.1  C) (Oral)  Resp 18  Ht 5\' 11"  (1.803 m)  Wt 155 lb (70.308 kg)  BMI 21.63 kg/m2  SpO2 99%  LMP 07/28/2015 Physical Exam  Constitutional: She appears well-developed and well-nourished. No distress.  HENT:  Head: Normocephalic and atraumatic.  Eyes: Conjunctivae are normal. Pupils are equal, round, and reactive to light.  Cardiovascular: Normal rate and regular rhythm.   Pulmonary/Chest: Effort normal. No respiratory distress.  Abdominal: Soft. Bowel sounds are normal. There is no tenderness.  Genitourinary: Rectal exam shows internal hemorrhoid. Rectal exam shows no external  hemorrhoid, no mass, no tenderness and anal tone normal. Guaiac positive stool.  Rectal exam with hemoccult performed. Hemoccult was positive. RN served as Producer, television/film/video during exam. No other abnormalities found besides the internal hemorrhoids and hemoccult result.   Musculoskeletal: She exhibits no edema or tenderness.  Neurological: She is alert.  Skin: Skin is warm and dry. She is not diaphoretic.  Nursing note and vitals reviewed.   ED Course  Procedures (including critical care time) Labs Review Labs Reviewed  POC OCCULT BLOOD, ED - Abnormal; Notable for the following:    Fecal Occult Bld POSITIVE (*)    All other components within normal limits  I-STAT CHEM 8, ED    Imaging Review No results found. I have personally reviewed and evaluated these images and lab results as part of my medical decision-making.   EKG Interpretation None      MDM   Final diagnoses:  Internal hemorrhoid, bleeding  Constipation, unspecified constipation type    [name] presents with rectal pain that "feels like hemorrhoids," accompanied by scant red blood.   Findings and plan of care discussed with Leonard Schwartz, MD.  Internal hemorrhoids suspected and confirmed on rectal exam. Patient discharged with Anusol, Colace, and MiraLAX for patient comfort. Patient stated keep her appointment with her PCP tomorrow and referred to PCP for chronic management of this issue. On my exam patient was not tachycardic, who was found to be tachycardic on subsequent examination. 10:26 AM Patient's pulse now back to her normal rate. Hemoglobin is normal. Plan of care communicated with the patient, who agreed to the plan and is comfortable with discharge.    Lorayne Bender, PA-C 08/11/15 1027  Leonard Schwartz, MD 08/12/15 [phone]

## 2015-08-11 NOTE — ED Notes (Signed)
Pt presents via POV c/o rectal bleeding Sunday when trying to have a BM.  Reports she thinks she has internal hemorrhoids, has had surgery for this before.  Reports being constipated and when she was straining noticed bright red blood.  Pt a x 4, NAD.

## 2015-08-11 NOTE — ED Notes (Signed)
PA at bedside.

## 2016-01-11 DIAGNOSIS — Z720 Tobacco use: Secondary | ICD-10-CM | POA: Insufficient documentation

## 2016-05-13 DIAGNOSIS — G8929 Other chronic pain: Secondary | ICD-10-CM | POA: Insufficient documentation

## 2016-05-13 DIAGNOSIS — M25561 Pain in right knee: Principal | ICD-10-CM

## 2016-06-18 ENCOUNTER — Encounter (HOSPITAL_COMMUNITY): Payer: Self-pay

## 2016-06-18 ENCOUNTER — Emergency Department (HOSPITAL_COMMUNITY): Payer: Managed Care, Other (non HMO)

## 2016-06-18 ENCOUNTER — Emergency Department (HOSPITAL_COMMUNITY)
Admission: EM | Admit: 2016-06-18 | Discharge: 2016-06-18 | Disposition: A | Discharge status: Home / Self Care | Payer: Managed Care, Other (non HMO) | Attending: Emergency Medicine | Admitting: Emergency Medicine

## 2016-06-18 DIAGNOSIS — W1839XA Other fall on same level, initial encounter: Secondary | ICD-10-CM | POA: Diagnosis not present

## 2016-06-18 DIAGNOSIS — Y9341 Activity, dancing: Secondary | ICD-10-CM | POA: Insufficient documentation

## 2016-06-18 DIAGNOSIS — F17200 Nicotine dependence, unspecified, uncomplicated: Secondary | ICD-10-CM | POA: Insufficient documentation

## 2016-06-18 DIAGNOSIS — Y999 Unspecified external cause status: Secondary | ICD-10-CM | POA: Diagnosis not present

## 2016-06-18 DIAGNOSIS — Y9229 Other specified public building as the place of occurrence of the external cause: Secondary | ICD-10-CM | POA: Insufficient documentation

## 2016-06-18 DIAGNOSIS — M25561 Pain in right knee: Secondary | ICD-10-CM | POA: Insufficient documentation

## 2016-06-18 MED ORDER — NAPROXEN 500 MG PO TABS: 500.0000 mg | ORAL_TABLET | Freq: Two times a day (BID) | ORAL | 0 refills | Status: DC | PRN

## 2016-06-18 NOTE — Discharge Instructions (Signed)
Read the information below.  Use the prescribed medication as directed.  Please discuss all new medications with your pharmacist.  You may return to the Emergency Department at any time for worsening condition or any new symptoms that concern you.     If you develop uncontrolled pain, weakness or numbness of the extremity, severe discoloration of the skin, or you are unable to move your knee or walk, return to the ER for a recheck.    °

## 2016-06-18 NOTE — Progress Notes (Signed)
Orthopedic Tech Progress Note Patient Details:  Gabrielle Brooks 27-Jun-1996 [id number]  Ortho Devices Type of Ortho Device: Knee Immobilizer, Crutches Ortho Device/Splint Location: rle Ortho Device/Splint Interventions: Application   Sorin Frimpong 06/18/2016, 2:11 PM

## 2016-06-18 NOTE — ED Notes (Signed)
Pt c/o right knee pain and swelling for several months. States it started hurting again last night, thinks it "was dislocated but went back in". No new injuries.

## 2016-06-18 NOTE — ED Notes (Signed)
Ortho tech in. 

## 2016-06-18 NOTE — ED Triage Notes (Signed)
Pt reports she felt her knee "dislocate" last night. Pt has hx of knee problems X1 month. Currently wearing a brace. Pt here to have another x-ray.

## 2016-06-18 NOTE — ED Notes (Signed)
Patient transported to X-ray 

## 2016-06-18 NOTE — ED Provider Notes (Signed)
[facility] DEPT Provider Note   CSN: [id number] Arrival date & time: 06/18/16  1155. By signing my name below, I, Gabrielle Brooks, attest that this documentation has been prepared under the direction and in the presence of Ryan Ogborn, PA-C. Electronically Signed: Georgette Brooks, ED Scribe. 06/18/16. 12:35 PM.  History   Chief Complaint Chief Complaint  Patient presents with  . Knee Pain   HPI Comments: Gabrielle Brooks is a 20 y.o. female who presents to the Emergency Department complaining of 8/10 constant, aching, right knee pain with swelling onset 6 months ago, worsening one day ago. Pt reports she was at the club last night when she felt her knee "dislocate".  States she felt it do this while she was dancing very gently, then fell while she was trying to fix it, after falling it felt like it was back in place.   Pt has h/o knee problems for 6 or 7 months now. Pt works at [employer] and notes a lot of physical labor. She has a brace put in place. She has taken Aleve with mild relief. Patient had MRI right knee 05/09/16 at [facility], ordered by urgent care, demonstrating abnormality of ACL without tearing. She was seen by sports medicine Dr Virginia Rochester in follow up, recommended physical therapy which she states she cannot afford. Pt has been going to the gym to substitute. Pt denies fever, diaphoresis, or any other associated symptoms. Denies weakness or numbness of the leg.    The history is provided by the patient. No language interpreter was used.    Past Medical History:  Diagnosis Date  . Constipation   . Hemorrhoid     There are no active problems to display for this patient.   Past Surgical History:  Procedure Laterality Date  . Hemorroid Removal    . HERNIA REPAIR    . NOSE SURGERY      OB History    No data available       Home Medications    Prior to Admission medications   Medication Sig Start Date End Date Taking? Authorizing Provider  cyclobenzaprine (FLEXERIL) 10 MG tablet Take 1  tablet (10 mg total) by mouth 3 (three) times daily as needed for muscle spasms. 08/03/15   Mercedes Camprubi-Soms, PA-C  docusate sodium (COLACE) 100 MG capsule Take 1 capsule (100 mg total) by mouth every 12 (twelve) hours. 08/11/15   Shawn C Joy, PA-C  HYDROcodone-acetaminophen (NORCO) 5-325 MG tablet Take 1 tablet by mouth every 6 (six) hours as needed for severe pain. 08/03/15   Mercedes Camprubi-Soms, PA-C  hydrocortisone (ANUCORT-HC) 25 MG suppository Place 1 suppository (25 mg total) rectally 2 (two) times daily. 08/11/15   Shawn C Joy, PA-C  metroNIDAZOLE (FLAGYL) 500 MG tablet Take 1 tablet (500 mg total) by mouth 2 (two) times daily. X 7 days 05/14/15   Janne Napoleon, NP  naproxen (NAPROSYN) 500 MG tablet Take 1 tablet (500 mg total) by mouth 2 (two) times daily as needed for mild pain, moderate pain or headache (TAKE WITH MEALS.). 06/18/16   Clayton Bibles, PA-C  nitrofurantoin, macrocrystal-monohydrate, (MACROBID) 100 MG capsule Take 1 capsule (100 mg total) by mouth 2 (two) times daily. 05/05/14   Carmin Muskrat, MD  ondansetron (ZOFRAN ODT) 4 MG disintegrating tablet Take 1 tablet (4 mg total) by mouth every 8 (eight) hours as needed for nausea or vomiting. 05/05/14   Carmin Muskrat, MD  polyethylene glycol [facility]) packet Take 17 g by mouth daily. 08/11/15   Shawn  C Joy, PA-C  promethazine (PHENERGAN) 25 MG tablet Take 1 tablet (25 mg total) by mouth every 6 (six) hours as needed for nausea or vomiting. 05/06/14   Antonietta Breach, PA-C    Family History History reviewed. No pertinent family history.  Social History Social History  Substance Use Topics  . Smoking status: Current Every Day Smoker    Types: Pipe  . Smokeless tobacco: Current User  . Alcohol use No     Allergies   Review of patient's allergies indicates no known allergies.   Review of Systems Review of Systems  Constitutional: Negative for diaphoresis and fever.  Cardiovascular: Negative for leg swelling.  Musculoskeletal:  Positive for arthralgias.  Skin: Negative for color change and pallor.  Allergic/Immunologic: Negative for immunocompromised state.  Neurological: Negative for weakness and numbness.  Hematological: Does not bruise/bleed easily.  Psychiatric/Behavioral: Negative for self-injury.     Physical Exam Updated Vital Signs BP 123/81 (BP Location: Right Arm)   Pulse 71   Temp 98.6 F (37 C) (Oral)   Resp 16   Ht 5\' 11"  (1.803 m)   Wt 68 kg   LMP 06/11/2016 (Within Days)   SpO2 100%   BMI 20.92 kg/m   Physical Exam  Constitutional: She appears well-developed and well-nourished.  HENT:  Head: Normocephalic and atraumatic.  Neck: Neck supple.  Pulmonary/Chest: Effort normal.  Musculoskeletal: She exhibits tenderness. She exhibits no edema.  Right knee: no erythema, edema, or warmth. No effusion noted. Fully flexes knee, extends out to approximately slightly less than full extension. No pain with stress in any direction, no laxity of joint. Patella appears located, calf non-tender, no edema. Distal pulses and sensation intact.  Neurological: She is alert.  Nursing note and vitals reviewed.    ED Treatments / Results  DIAGNOSTIC STUDIES: Oxygen Saturation is 100% on RA, normal by my interpretation.    COORDINATION OF CARE: 12:32 PM Discussed treatment plan with pt at bedside which includes x-ray and pt agreed to plan.  Labs (all labs ordered are listed, but only abnormal results are displayed) Labs Reviewed - No data to display  EKG  EKG Interpretation None       Radiology Dg Knee Complete 4 Views Right  Result Date: 06/18/2016 CLINICAL DATA:  Painful RIGHT knee EXAM: RIGHT KNEE - COMPLETE 4+ VIEW COMPARISON:  None. FINDINGS: No fracture of the proximal tibia or distal femur. Patella is normal. No joint effusion. IMPRESSION: No acute osseous abnormality. Electronically Signed   By: Suzy Bouchard M.D.   On: 06/18/2016 13:06    Procedures Procedures (including  critical care time)  Medications Ordered in ED Medications - No data to display   Initial Impression / Assessment and Plan / ED Course  I have reviewed the triage vital signs and the nursing notes.  Pertinent labs & imaging results that were available during my care of the patient were reviewed by me and considered in my medical decision making (see chart for details).  Clinical Course    Afebrile, nontoxic patient with chronic right knee pain with episode last night that she describes as feeling like it dislocated.  Given the gentle dancing she was doing, that she is a thin person, no other trauma _ I doubt this.  Possibly dislocated patella but it spontaneously resolved.  Exam unremarkable.  Xray negative.   D/C home with sports medicine follow up, knee immobilizer, crutches.   Discussed result, findings, treatment, and follow up  with patient.  Pt given  return precautions.  Pt verbalizes understanding and agrees with plan.       Final Clinical Impressions(s) / ED Diagnoses   Final diagnoses:  Right knee pain    New Prescriptions Current Discharge Medication List      I personally performed the services described in this documentation, which was scribed in my presence. The recorded information has been reviewed and is accurate.     Clayton Bibles, PA-C 06/18/16 [place], MD 06/20/16 [phone]

## 2016-07-01 DIAGNOSIS — M25361 Other instability, right knee: Secondary | ICD-10-CM | POA: Insufficient documentation

## 2016-08-18 ENCOUNTER — Ambulatory Visit (INDEPENDENT_AMBULATORY_CARE_PROVIDER_SITE_OTHER): Payer: Managed Care, Other (non HMO) | Admitting: Family Medicine

## 2016-08-18 ENCOUNTER — Encounter: Payer: Self-pay | Admitting: Family Medicine

## 2016-08-18 DIAGNOSIS — G8929 Other chronic pain: Secondary | ICD-10-CM | POA: Diagnosis not present

## 2016-08-18 DIAGNOSIS — M25561 Pain in right knee: Secondary | ICD-10-CM | POA: Diagnosis not present

## 2016-08-18 NOTE — Patient Instructions (Signed)
Your pain is related to kneecap not tracking properly and your recent patellar dislocation Avoid painful activities when possible (often deep squats, lunges bother this) Work restrictions for next 6 weeks. Straight leg raise, hip side raises, straight leg raises with foot turned outwards 3 sets of 10 once a day. Add ankle weight if thesee become too easy. Continue formal physical therapy, do home exercises on days you don't go to therapy. Correct foot breakdown with something like dr. Zoe Lan active series or other inserts. Avoid flat shoes, barefoot walking as much as possible the next 6 weeks. Icing 15 minutes at a time 3-4 times a day as needed. Tylenol or aleve as needed for pain Knee brace when up and walking around or standing as much as possible. Follow up with me in 6 weeks.

## 2016-08-22 NOTE — Assessment & Plan Note (Signed)
2/2 patellofemoral syndrome and recent patellar dislocation.  Shown home exercises to do daily.  Arch supports given her pronation.  Discussed icing, tylenol or aleve if needed.  Knee brace or sleeve for support.  Written for light duty for 6 weeks (seated for 15 minutes hourly).  F/u at that time.

## 2016-08-22 NOTE — Progress Notes (Signed)
PCP: Janeece Agee, PA  Subjective:   HPI: Patient is a 20 y.o. female here for right knee pain.  Patient reports initially she developed anterior right knee pain without an injury 7 months ago. Tried a brace, icing. Had an MRI at the time that only showed a cyst on her ACL. Did physical therapy but did not seem to improve. Then 2 months ago she dislocated her right patella. Wore a brace then. Has continued to struggle with pain to 5/10 level. Worse with standing or sitting for a long time. No skin changes, numbness  Past Medical History:  Diagnosis Date  . Constipation   . Hemorrhoid     Current Outpatient Prescriptions on File Prior to Visit  Medication Sig Dispense Refill  . cyclobenzaprine (FLEXERIL) 10 MG tablet Take 1 tablet (10 mg total) by mouth 3 (three) times daily as needed for muscle spasms. 15 tablet 0  . docusate sodium (COLACE) 100 MG capsule Take 1 capsule (100 mg total) by mouth every 12 (twelve) hours. 60 capsule 0  . HYDROcodone-acetaminophen (NORCO) 5-325 MG tablet Take 1 tablet by mouth every 6 (six) hours as needed for severe pain. 6 tablet 0  . hydrocortisone (ANUCORT-HC) 25 MG suppository Place 1 suppository (25 mg total) rectally 2 (two) times daily. 12 suppository 0  . metroNIDAZOLE (FLAGYL) 500 MG tablet Take 1 tablet (500 mg total) by mouth 2 (two) times daily. X 7 days 14 tablet 0  . naproxen (NAPROSYN) 500 MG tablet Take 1 tablet (500 mg total) by mouth 2 (two) times daily as needed for mild pain, moderate pain or headache (TAKE WITH MEALS.). 20 tablet 0  . nitrofurantoin, macrocrystal-monohydrate, (MACROBID) 100 MG capsule Take 1 capsule (100 mg total) by mouth 2 (two) times daily. 14 capsule 0  . ondansetron (ZOFRAN ODT) 4 MG disintegrating tablet Take 1 tablet (4 mg total) by mouth every 8 (eight) hours as needed for nausea or vomiting. 20 tablet 0  . polyethylene glycol (MIRALAX) packet Take 17 g by mouth daily. 14 each 0  . promethazine  (PHENERGAN) 25 MG tablet Take 1 tablet (25 mg total) by mouth every 6 (six) hours as needed for nausea or vomiting. 12 tablet 0   No current facility-administered medications on file prior to visit.     Past Surgical History:  Procedure Laterality Date  . Hemorroid Removal    . HERNIA REPAIR    . NOSE SURGERY      No Known Allergies  Social History   Social History  . Marital status: Single    Spouse name: N/A  . Number of children: N/A  . Years of education: N/A   Occupational History  . Not on file.   Social History Main Topics  . Smoking status: Current Every Day Smoker    Types: Pipe  . Smokeless tobacco: Current User  . Alcohol use No  . Drug use:     Types: Marijuana  . Sexual activity: Yes    Birth control/ protection: Condom   Other Topics Concern  . Not on file   Social History Narrative  . No narrative on file    No family history on file.  BP 116/74   Pulse 80   Ht 5\' 11"  (1.803 m)   Wt 150 lb (68 kg)   BMI 20.92 kg/m   Review of Systems: See HPI above.     Objective:  Physical Exam:  Gen: NAD, comfortable in exam room  Right knee: No  gross deformity, ecchymoses, swelling.  Hypermobile patella.  Mild VMO atrophy.  Mod overpronation. Mild pain post patellar facets. FROM. Negative ant/post drawers. Negative valgus/varus testing. Negative lachmanns. Negative mcmurrays, apleys, patellar apprehension. NV intact distally. Hip abduction 5/5.  Left knee: FROM without pain.   Assessment & Plan:  1. Right knee pain - 2/2 patellofemoral syndrome and recent patellar dislocation.  Shown home exercises to do daily.  Arch supports given her pronation.  Discussed icing, tylenol or aleve if needed.  Knee brace or sleeve for support.  Written for light duty for 6 weeks (seated for 15 minutes hourly).  F/u at that time.

## 2016-11-27 ENCOUNTER — Emergency Department (HOSPITAL_BASED_OUTPATIENT_CLINIC_OR_DEPARTMENT_OTHER)
Admission: EM | Admit: 2016-11-27 | Discharge: 2016-11-27 | Disposition: A | Discharge status: Home / Self Care | Payer: Managed Care, Other (non HMO) | Attending: Emergency Medicine | Admitting: Emergency Medicine

## 2016-11-27 ENCOUNTER — Encounter (HOSPITAL_BASED_OUTPATIENT_CLINIC_OR_DEPARTMENT_OTHER): Payer: Self-pay | Admitting: Emergency Medicine

## 2016-11-27 DIAGNOSIS — K5900 Constipation, unspecified: Secondary | ICD-10-CM

## 2016-11-27 DIAGNOSIS — K644 Residual hemorrhoidal skin tags: Secondary | ICD-10-CM

## 2016-11-27 LAB — URINALYSIS, ROUTINE W REFLEX MICROSCOPIC
Bilirubin Urine: NEGATIVE
Glucose, UA: NEGATIVE mg/dL
Hgb urine dipstick: NEGATIVE
KETONES UR: NEGATIVE mg/dL
LEUKOCYTES UA: NEGATIVE
NITRITE: NEGATIVE
PH: 7 (ref 5.0–8.0)
Protein, ur: NEGATIVE mg/dL
SPECIFIC GRAVITY, URINE: 1.029 (ref 1.005–1.030)

## 2016-11-27 MED ORDER — HYDROCORTISONE ACETATE 25 MG RE SUPP: 25.0000 mg | Freq: Two times a day (BID) | RECTAL | 0 refills | Status: DC

## 2016-11-27 NOTE — ED Notes (Signed)
ED Provider at bedside. 

## 2016-11-27 NOTE — ED Triage Notes (Signed)
Pt reports constipation and hemorrhoids x2 days (no bleeding), took fleet enema without relief.  No abd pain, denies n/v/d at this time, did have diarrhea 3 days ago.  Pt currently taking antibiotics for UTI.  Alert and oriented.

## 2016-11-27 NOTE — ED Provider Notes (Signed)
By signing my name below, I, Jeanell Sparrow, attest that this documentation has been prepared under the direction and in the presence of [place], DO. Electronically Signed: Jeanell Sparrow, Scribe. 11/27/2016. 11:20 PM.  TIME SEEN: 11:20 PM  CHIEF COMPLAINT: Constipation  HPI:  HPI Comments: Gabrielle Brooks is a 21 y.o. female who presents to the Emergency Department complaining of constant moderate constipation that started about 2 days ago. She was on Cipro for a UTI that is now resolved. She also has an associated hemorrhoid. She took an enema for constipation without relief. She has been using Preparation-H ointment and suppositories for hemorrhoid. She denies any hx of abdominal surgeries, hx of bowel obstruction, fever, chills, rectal bleeding, melena. She denies nausea or vomiting. No longer having dysuria. Currently on her menstrual cycle.    ROS: See HPI Constitutional: no fever no chills Eyes: no drainage  ENT: no runny nose   Cardiovascular:  no chest pain  Resp: no SOB  GI: no vomiting +constipation GU: no dysuria Integumentary: no rash  Allergy: no hives  Musculoskeletal: no leg swelling  Neurological: no slurred speech ROS otherwise negative  PAST MEDICAL HISTORY/PAST SURGICAL HISTORY:  Past Medical History:  Diagnosis Date  . Constipation   . Hemorrhoid     MEDICATIONS:  Prior to Admission medications   Medication Sig Start Date End Date Taking? Authorizing Provider  cyclobenzaprine (FLEXERIL) 10 MG tablet Take 1 tablet (10 mg total) by mouth 3 (three) times daily as needed for muscle spasms. 08/03/15   Mercedes Street, PA-C  docusate sodium (COLACE) 100 MG capsule Take 1 capsule (100 mg total) by mouth every 12 (twelve) hours. 08/11/15   Shawn C Joy, PA-C  HYDROcodone-acetaminophen (NORCO) 5-325 MG tablet Take 1 tablet by mouth every 6 (six) hours as needed for severe pain. 08/03/15   Mercedes Street, PA-C  hydrocortisone (ANUCORT-HC) 25 MG suppository Place 1  suppository (25 mg total) rectally 2 (two) times daily. 08/11/15   Shawn C Joy, PA-C  metroNIDAZOLE (FLAGYL) 500 MG tablet Take 1 tablet (500 mg total) by mouth 2 (two) times daily. X 7 days 05/14/15   Janne Napoleon, NP  naproxen (NAPROSYN) 500 MG tablet Take 1 tablet (500 mg total) by mouth 2 (two) times daily as needed for mild pain, moderate pain or headache (TAKE WITH MEALS.). 06/18/16   Clayton Bibles, PA-C  nitrofurantoin, macrocrystal-monohydrate, (MACROBID) 100 MG capsule Take 1 capsule (100 mg total) by mouth 2 (two) times daily. 05/05/14   Carmin Muskrat, MD  ondansetron (ZOFRAN ODT) 4 MG disintegrating tablet Take 1 tablet (4 mg total) by mouth every 8 (eight) hours as needed for nausea or vomiting. 05/05/14   Carmin Muskrat, MD  polyethylene glycol [facility]) packet Take 17 g by mouth daily. 08/11/15   Shawn C Joy, PA-C  promethazine (PHENERGAN) 25 MG tablet Take 1 tablet (25 mg total) by mouth every 6 (six) hours as needed for nausea or vomiting. 05/06/14   Antonietta Breach, PA-C    ALLERGIES:  No Known Allergies  SOCIAL HISTORY:  Social History  Substance Use Topics  . Smoking status: Never Smoker  . Smokeless tobacco: Current User  . Alcohol use No    FAMILY HISTORY: History reviewed. No pertinent family history.  EXAM: BP 131/89 (BP Location: Left Arm)   Pulse 64   Temp 98.3 F (36.8 C) (Oral)   Resp 18   Ht 5\' 11"  (1.803 m)   Wt 150 lb (68 kg)   LMP 11/25/2016 (  Exact Date)   SpO2 100%   BMI 20.92 kg/m  CONSTITUTIONAL: Alert and oriented and responds appropriately to questions. Well-appearing; well-nourished, afebrile and nontoxic, appears well-hydrated HEAD: Normocephalic EYES: Conjunctivae clear, PERRL, EOMI ENT: normal nose; no rhinorrhea; moist mucous membranes NECK: Supple, no meningismus, no nuchal rigidity, no LAD  CARD: RRR; S1 and S2 appreciated; no murmurs, no clicks, no rubs, no gallops RESP: Normal chest excursion without splinting or tachypnea; breath sounds clear and  equal bilaterally; no wheezes, no rhonchi, no rales, no hypoxia or respiratory distress, speaking full sentences ABD/GI: Normal bowel sounds; non-distended; soft, non-tender, no rebound, no guarding, no peritoneal signs, no hepatosplenomegaly RECTAL:  Normal rectal tone, no gross blood or melena, patient has one large nonthrombosed external nonbleeding hemorrhoid on exam, nontender rectal exam, no fecal impaction, there is no stool in the rectal vault BACK:  The back appears normal and is non-tender to palpation, there is no CVA tenderness EXT: Normal ROM in all joints; non-tender to palpation; no edema; normal capillary refill; no cyanosis, no calf tenderness or swelling    SKIN: Normal color for age and race; warm; no rash NEURO: Moves all extremities equally, sensation to light touch intact diffusely, cranial nerves II through XII intact, normal speech PSYCH: The patient's mood and manner are appropriate. Grooming and personal hygiene are appropriate.  MEDICAL DECISION MAKING: Patient here with constipation. Has not had a bowel movement in 2 days. Nothing on her exam to suggest bowel obstruction. Abdominal exam completely benign. No abdominal distention. No fevers. No nausea or vomiting. She does have an external hemorrhoid and has been using Preparation H. Have recommended increased water and fiber intake, MiraLAX and Colace twice a day. Have recommended she continue Preparation H, witch hazel wipes and will discharge with Anusol suppositories as this has helped her in the past. Hemorrhoid today is not thrombosed. She has had previous hemorrhoidectomy and have recommended surgical follow-up if symptoms continue, worsen. No fecal impaction on exam therefore I do not feel she needs an enema at this time or disimpaction. I feel she is safe to be discharged home. Discussed at length the supportive care instructions. She is comfortable with this plan. She has a PCP for follow-up.  At this time, I do not  feel there is any life-threatening condition present. I have reviewed and discussed all results (EKG, imaging, lab, urine as appropriate) and exam findings with patient/family. I have reviewed nursing notes and appropriate previous records.  I feel the patient is safe to be discharged home without further emergent workup and can continue workup as an outpatient as needed. Discussed usual and customary return precautions. Patient/family verbalize understanding and are comfortable with this plan.  Outpatient follow-up has been provided. All questions have been answered.  I personally performed the services described in this documentation, which was scribed in my presence. The recorded information has been reviewed and is accurate.     [place], DO 11/28/16 [phone]

## 2016-11-27 NOTE — Discharge Instructions (Signed)
I recommend that you increase your water and fiber intake. If you are not able to eat foods high in fiber you may use fiber or Metamucil over-the-counter. I also recommend you use MiraLAX 1-2 times a day and Colace 100 mg twice a day to help with bowel movements. These medications are over the counter. You may use other over-the-counter medication such as Dulcolax, enemas, magnesium citrate as needed for constipation. Please note that some of these medications may cause you to have abdominal cramping which is normal. If you develop severe abdominal pain, fever, vomiting, distention of your abdomen, please return to the hospital.   For your hemorrhoids, I recommend that you continue a stool softener to keep your bowel movements soft. You may use the Colace 100 mg twice daily for this. I also recommend that you do not use toilet paper and instead use witch hazel wipes or baby wipes. A high-fiber diet will also help to keep your stools soft as well increasing her water intake. You may continue using preparation H over the counter.
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
## 2017-02-02 ENCOUNTER — Encounter (HOSPITAL_BASED_OUTPATIENT_CLINIC_OR_DEPARTMENT_OTHER): Payer: Self-pay | Admitting: Emergency Medicine

## 2017-02-02 DIAGNOSIS — N390 Urinary tract infection, site not specified: Secondary | ICD-10-CM | POA: Insufficient documentation

## 2017-02-02 DIAGNOSIS — F17200 Nicotine dependence, unspecified, uncomplicated: Secondary | ICD-10-CM | POA: Insufficient documentation

## 2017-02-02 DIAGNOSIS — B373 Candidiasis of vulva and vagina: Secondary | ICD-10-CM | POA: Diagnosis not present

## 2017-02-02 DIAGNOSIS — R102 Pelvic and perineal pain: Secondary | ICD-10-CM | POA: Diagnosis present

## 2017-02-02 LAB — URINALYSIS, ROUTINE W REFLEX MICROSCOPIC
BILIRUBIN URINE: NEGATIVE
Glucose, UA: NEGATIVE mg/dL
KETONES UR: NEGATIVE mg/dL
Nitrite: NEGATIVE
PROTEIN: NEGATIVE mg/dL
Specific Gravity, Urine: 1.019 (ref 1.005–1.030)
pH: 6 (ref 5.0–8.0)

## 2017-02-02 LAB — URINALYSIS, MICROSCOPIC (REFLEX)

## 2017-02-02 LAB — PREGNANCY, URINE: Preg Test, Ur: NEGATIVE

## 2017-02-02 NOTE — ED Triage Notes (Signed)
Pt c/o vaginal pain and discharge after unprotected intercourse 2 days ago. Denies Fever.

## 2017-02-03 ENCOUNTER — Emergency Department (HOSPITAL_BASED_OUTPATIENT_CLINIC_OR_DEPARTMENT_OTHER)
Admission: EM | Admit: 2017-02-03 | Discharge: 2017-02-03 | Disposition: A | Discharge status: Home / Self Care | Payer: Managed Care, Other (non HMO) | Attending: Emergency Medicine | Admitting: Emergency Medicine

## 2017-02-03 DIAGNOSIS — B373 Candidiasis of vulva and vagina: Secondary | ICD-10-CM

## 2017-02-03 DIAGNOSIS — N390 Urinary tract infection, site not specified: Secondary | ICD-10-CM

## 2017-02-03 DIAGNOSIS — B3731 Acute candidiasis of vulva and vagina: Secondary | ICD-10-CM

## 2017-02-03 LAB — WET PREP, GENITAL
CLUE CELLS WET PREP: NONE SEEN
SPERM: NONE SEEN
TRICH WET PREP: NONE SEEN

## 2017-02-03 MED ORDER — NITROFURANTOIN MONOHYD MACRO 100 MG PO CAPS: 100.0000 mg | ORAL_CAPSULE | Freq: Two times a day (BID) | ORAL | 0 refills | Status: DC

## 2017-02-03 MED ORDER — NITROFURANTOIN MONOHYD MACRO 100 MG PO CAPS
100.0000 mg | ORAL_CAPSULE | Freq: Once | ORAL | Status: AC
  Administered 2017-02-03: 100 mg via ORAL
  Filled 2017-02-03: qty 1

## 2017-02-03 MED ORDER — FLUCONAZOLE 50 MG PO TABS
150.0000 mg | ORAL_TABLET | Freq: Once | ORAL | Status: AC
  Administered 2017-02-03: 150 mg via ORAL
  Filled 2017-02-03: qty 1

## 2017-02-03 NOTE — ED Provider Notes (Signed)
[facility] Provider Note: Georgena Spurling, MD, FACEP  CSN: [id number] MRN: [id number] ARRIVAL: 02/02/17 at 2258 ROOM: [place]  Vaginal Pain   HISTORY OF PRESENT ILLNESS  Gabrielle Brooks is a 21 y.o. female who complains of vulvovaginal irritation the past 2 days. She is experiencing burning of her labia which is not severe. It is worse with palpation or movement but not with urination. She denies dysuria, fever or chills. She denies nausea, vomiting or diarrhea. She is having a clear vaginal discharge. She is not having any vaginal bleeding.   Past Medical History:  Diagnosis Date  . Constipation   . Hemorrhoid     Past Surgical History:  Procedure Laterality Date  . Hemorroid Removal    . HERNIA REPAIR    . NOSE SURGERY      No family history on file.  Social History  Substance Use Topics  . Smoking status: Never Smoker  . Smokeless tobacco: Current User  . Alcohol use Yes    Prior to Admission medications   Medication Sig Start Date End Date Taking? Authorizing Provider  hydrocortisone (ANUCORT-HC) 25 MG suppository Place 1 suppository (25 mg total) rectally 2 (two) times daily. 11/27/16   Delice Bison Ward, DO    Allergies Patient has no known allergies.   REVIEW OF SYSTEMS  Negative except as noted here or in the History of Present Illness.   PHYSICAL EXAMINATION  Initial Vital Signs Blood pressure 112/69, pulse 69, temperature 98.3 F (36.8 C), temperature source Oral, resp. rate 14, last menstrual period 01/10/2017, SpO2 100 %.  Examination General: Well-developed, well-nourished female in no acute distress; appearance consistent with age of record HENT: normocephalic; atraumatic Eyes: pupils equal, round and reactive to light; extraocular muscles intact Neck: supple Heart: regular rate and rhythm Lungs: clear to auscultation bilaterally Abdomen: soft; nondistended; nontender; no masses or hepatosplenomegaly; bowel sounds  present GU: No CVA tenderness; mild inflammation of the labia minora with translucent white vaginal discharge; no cervical motion tenderness; no adnexal tenderness; no vaginal bleeding Extremities: No deformity; full range of motion; pulses normal Neurologic: Awake, alert and oriented; motor function intact in all extremities and symmetric; no facial droop Skin: Warm and dry Psychiatric: Normal mood and affect   RESULTS  Summary of this visit's results, reviewed by myself:   EKG Interpretation  Date/Time:    Ventricular Rate:    PR Interval:    QRS Duration:   QT Interval:    QTC Calculation:   R Axis:     Text Interpretation:        Laboratory Studies: Results for orders placed or performed during the hospital encounter of 02/03/17 (from the past 24 hour(s))  Urinalysis, Routine w reflex microscopic     Status: Abnormal   Collection Time: 02/02/17 11:14 PM  Result Value Ref Range   Color, Urine YELLOW YELLOW   APPearance CLOUDY (A) CLEAR   Specific Gravity, Urine 1.019 1.005 - 1.030   pH 6.0 5.0 - 8.0   Glucose, UA NEGATIVE NEGATIVE mg/dL   Hgb urine dipstick TRACE (A) NEGATIVE   Bilirubin Urine NEGATIVE NEGATIVE   Ketones, ur NEGATIVE NEGATIVE mg/dL   Protein, ur NEGATIVE NEGATIVE mg/dL   Nitrite NEGATIVE NEGATIVE   Leukocytes, UA LARGE (A) NEGATIVE  Pregnancy, urine     Status: None   Collection Time: 02/02/17 11:14 PM  Result Value Ref Range   Preg Test, Ur NEGATIVE NEGATIVE  Urinalysis, Microscopic (reflex)  Status: Abnormal   Collection Time: 02/02/17 11:14 PM  Result Value Ref Range   RBC / HPF 0-5 0 - 5 RBC/hpf   WBC, UA TOO NUMEROUS TO COUNT 0 - 5 WBC/hpf   Bacteria, UA FEW (A) NONE SEEN   Squamous Epithelial / LPF 6-30 (A) NONE SEEN  Wet prep, genital     Status: Abnormal   Collection Time: 02/03/17  2:48 AM  Result Value Ref Range   Yeast Wet Prep HPF POC PRESENT (A) NONE SEEN   Trich, Wet Prep NONE SEEN NONE SEEN   Clue Cells Wet Prep HPF POC  NONE SEEN NONE SEEN   WBC, Wet Prep HPF POC MODERATE (A) NONE SEEN   Sperm NONE SEEN    Imaging Studies: No results found.  ED COURSE  Nursing notes and initial vitals signs, including pulse oximetry, reviewed.  Vitals:   02/02/17 2316 02/03/17 0125  BP: 117/82 112/69  Pulse: 89 69  Resp: 16 14  Temp: 98.3 F (36.8 C)   TempSrc: Oral   SpO2: 100% 100%    PROCEDURES    ED DIAGNOSES     ICD-9-CM ICD-10-CM   1. Candidal vulvovaginitis 112.1 B37.3   2. Lower urinary tract infection 599.0 N39.0        Shanon Rosser, MD 02/03/17 [phone]

## 2017-02-03 NOTE — ED Notes (Signed)
Pt verbalizes understanding of d/c instructions and denies any further needs at this time. 

## 2017-02-04 LAB — URINE CULTURE: Culture: <10000 — AB

## 2017-02-06 LAB — GC/CHLAMYDIA PROBE AMP (~~LOC~~) NOT AT ARMC
CHLAMYDIA, DNA PROBE: NEGATIVE
NEISSERIA GONORRHEA: NEGATIVE

## 2017-06-17 ENCOUNTER — Inpatient Hospital Stay (HOSPITAL_COMMUNITY)
Admission: AD | Admit: 2017-06-17 | Discharge: 2017-06-17 | Disposition: A | Discharge status: Home / Self Care | Payer: BLUE CROSS/BLUE SHIELD | Source: Ambulatory Visit | Attending: Obstetrics and Gynecology | Admitting: Obstetrics and Gynecology

## 2017-06-17 ENCOUNTER — Encounter (HOSPITAL_COMMUNITY): Payer: Self-pay | Admitting: *Deleted

## 2017-06-17 DIAGNOSIS — Z3A01 Less than 8 weeks gestation of pregnancy: Secondary | ICD-10-CM | POA: Insufficient documentation

## 2017-06-17 DIAGNOSIS — R112 Nausea with vomiting, unspecified: Secondary | ICD-10-CM | POA: Diagnosis present

## 2017-06-17 DIAGNOSIS — O210 Mild hyperemesis gravidarum: Secondary | ICD-10-CM | POA: Insufficient documentation

## 2017-06-17 DIAGNOSIS — Z9889 Other specified postprocedural states: Secondary | ICD-10-CM | POA: Diagnosis not present

## 2017-06-17 DIAGNOSIS — Z3201 Encounter for pregnancy test, result positive: Secondary | ICD-10-CM | POA: Insufficient documentation

## 2017-06-17 LAB — RAPID URINE DRUG SCREEN, HOSP PERFORMED
Amphetamines: NOT DETECTED
Barbiturates: NOT DETECTED
Benzodiazepines: NOT DETECTED
Cocaine: NOT DETECTED
OPIATES: NOT DETECTED
TETRAHYDROCANNABINOL: POSITIVE — AB

## 2017-06-17 LAB — URINALYSIS, ROUTINE W REFLEX MICROSCOPIC
BILIRUBIN URINE: NEGATIVE
Glucose, UA: NEGATIVE mg/dL
HGB URINE DIPSTICK: NEGATIVE
Ketones, ur: NEGATIVE mg/dL
Leukocytes, UA: NEGATIVE
Nitrite: NEGATIVE
Protein, ur: NEGATIVE mg/dL
SPECIFIC GRAVITY, URINE: 1.02 (ref 1.005–1.030)
pH: 7 (ref 5.0–8.0)

## 2017-06-17 LAB — PREGNANCY, URINE: Preg Test, Ur: POSITIVE — AB

## 2017-06-17 LAB — POCT PREGNANCY, URINE: Preg Test, Ur: POSITIVE — AB

## 2017-06-17 MED ORDER — PROMETHAZINE HCL 25 MG PO TABS: 25.0000 mg | ORAL_TABLET | Freq: Four times a day (QID) | ORAL | 1 refills | Status: DC | PRN

## 2017-06-17 MED ORDER — PROMETHAZINE HCL 25 MG PO TABS
25.0000 mg | ORAL_TABLET | Freq: Once | ORAL | Status: AC
  Administered 2017-06-17: 25 mg via ORAL
  Filled 2017-06-17: qty 1

## 2017-06-17 NOTE — MAU Note (Signed)
Having severe nausea and vomiting.  thrown up just once today, but hasn't really kept anything down in 3 days.  Is pregnant, + HPT on Labor day.Marland Kitchen

## 2017-06-17 NOTE — Progress Notes (Addendum)
G1 @ ? Presents to triage for nausea x1 today. Last period was Aug 4. Denies bleeding.   Provider at bs assessing. Phenergan ordered for nausea  1348: Discharge instructions given with pt understanding. Pt left unit via ambulatory with SO

## 2017-06-17 NOTE — Discharge Instructions (Signed)

## 2017-06-17 NOTE — MAU Provider Note (Signed)
History    G1 @ 6 wks in with c/o nausea and vomiting that happens once or twice a day. CSN: [id number]  Arrival date & time 06/17/17  1255   None     Chief Complaint  Patient presents with  . Possible Pregnancy  . Emesis    HPI  Past Medical History:  Diagnosis Date  . Constipation   . Hemorrhoid     Past Surgical History:  Procedure Laterality Date  . Hemorroid Removal    . HERNIA REPAIR    . NOSE SURGERY      No family history on file.  Social History  Substance Use Topics  . Smoking status: Never Smoker  . Smokeless tobacco: Current User  . Alcohol use Yes    OB History    No data available      Review of Systems  Constitutional: Negative.   HENT: Negative.   Eyes: Negative.   Respiratory: Negative.   Cardiovascular: Negative.   Gastrointestinal: Positive for nausea and vomiting.  Endocrine: Negative.   Genitourinary: Negative.   Musculoskeletal: Negative.   Skin: Negative.   Allergic/Immunologic: Negative.   Neurological: Negative.   Hematological: Negative.   Psychiatric/Behavioral: Negative.     Allergies  Patient has no known allergies.  Home Medications    BP 112/74 (BP Location: Right Arm)   Pulse 78   Temp 98.6 F (37 C) (Oral)   Resp 16   Wt 159 lb 12 oz (72.5 kg)   LMP 05/06/2017   SpO2 100%   BMI 22.28 kg/m   Physical Exam  Constitutional: She is oriented to person, place, and time. She appears well-developed and well-nourished.  HENT:  Head: Normocephalic.  Eyes: Pupils are equal, round, and reactive to light.  Neck: Normal range of motion.  Cardiovascular: Normal rate, regular rhythm, normal heart sounds and intact distal pulses.   Pulmonary/Chest: Effort normal and breath sounds normal.  Abdominal: Soft. Bowel sounds are normal.  Genitourinary: Vagina normal and uterus normal.  Musculoskeletal: Normal range of motion.  Neurological: She is alert and oriented to person, place, and time. She has normal reflexes.   Skin: Skin is warm and dry.  Psychiatric: She has a normal mood and affect. Her behavior is normal. Judgment and thought content normal.    MAU Course  Procedures (including critical care time)  Labs Reviewed  POCT PREGNANCY, URINE - Abnormal; Notable for the following:       Result Value   Preg Test, Ur POSITIVE (*)    All other components within normal limits  URINALYSIS, ROUTINE W REFLEX MICROSCOPIC  RAPID URINE DRUG SCREEN, HOSP PERFORMED  PREGNANCY, URINE   No results found.   1. Morning sickness       MDM  VSS, discussed that this is a normal complaint in early pregnancy and I reviewed with pt home measures to lessen morning sickness but she is demanding medication. I explained to her that meds can affect fetus in 1st trimester but she is adamant we prescribe her medication.  U/A totally normal will d/c home with rx for phenergan

## 2017-07-28 LAB — OB RESULTS CONSOLE RPR: RPR: NONREACTIVE

## 2017-07-28 LAB — OB RESULTS CONSOLE RUBELLA ANTIBODY, IGM: Rubella: IMMUNE

## 2017-07-28 LAB — OB RESULTS CONSOLE HEPATITIS B SURFACE ANTIGEN: Hepatitis B Surface Ag: NEGATIVE

## 2017-07-28 LAB — OB RESULTS CONSOLE GC/CHLAMYDIA
CHLAMYDIA, DNA PROBE: NEGATIVE
Gonorrhea: NEGATIVE

## 2017-07-28 LAB — OB RESULTS CONSOLE HIV ANTIBODY (ROUTINE TESTING): HIV: NONREACTIVE

## 2017-07-31 ENCOUNTER — Emergency Department (HOSPITAL_BASED_OUTPATIENT_CLINIC_OR_DEPARTMENT_OTHER)
Admission: EM | Admit: 2017-07-31 | Discharge: 2017-07-31 | Disposition: A | Discharge status: Home / Self Care | Payer: BLUE CROSS/BLUE SHIELD | Attending: Emergency Medicine | Admitting: Emergency Medicine

## 2017-07-31 ENCOUNTER — Encounter (HOSPITAL_BASED_OUTPATIENT_CLINIC_OR_DEPARTMENT_OTHER): Payer: Self-pay

## 2017-07-31 DIAGNOSIS — G8929 Other chronic pain: Secondary | ICD-10-CM | POA: Diagnosis not present

## 2017-07-31 DIAGNOSIS — Z3A12 12 weeks gestation of pregnancy: Secondary | ICD-10-CM | POA: Diagnosis not present

## 2017-07-31 DIAGNOSIS — O99281 Endocrine, nutritional and metabolic diseases complicating pregnancy, first trimester: Secondary | ICD-10-CM | POA: Diagnosis not present

## 2017-07-31 DIAGNOSIS — O9989 Other specified diseases and conditions complicating pregnancy, childbirth and the puerperium: Secondary | ICD-10-CM | POA: Insufficient documentation

## 2017-07-31 DIAGNOSIS — E860 Dehydration: Secondary | ICD-10-CM | POA: Insufficient documentation

## 2017-07-31 DIAGNOSIS — R3914 Feeling of incomplete bladder emptying: Secondary | ICD-10-CM | POA: Insufficient documentation

## 2017-07-31 LAB — URINALYSIS, ROUTINE W REFLEX MICROSCOPIC
Bilirubin Urine: NEGATIVE
GLUCOSE, UA: NEGATIVE mg/dL
HGB URINE DIPSTICK: NEGATIVE
Ketones, ur: 15 mg/dL — AB
LEUKOCYTES UA: NEGATIVE
Nitrite: NEGATIVE
PH: 6 (ref 5.0–8.0)
PROTEIN: NEGATIVE mg/dL

## 2017-07-31 MED ORDER — SODIUM CHLORIDE 0.9 % IV BOLUS (SEPSIS)
1000.0000 mL | Freq: Once | INTRAVENOUS | Status: AC
  Administered 2017-07-31: 1000 mL via INTRAVENOUS

## 2017-07-31 NOTE — ED Triage Notes (Signed)
Pt c/o urinary retention-states "my bladder feels full but I can't go"-NAD-steady gait-pt is [redacted] weeks pregnant

## 2017-07-31 NOTE — ED Provider Notes (Signed)
[facility] EMERGENCY DEPARTMENT Provider Note   CSN: [id number] Arrival date & time: 07/31/17  1444     History   Chief Complaint Chief Complaint  Patient presents with  . Urinary Retention    HPI Gabrielle Brooks is a 21 y.o. female, G1P0, presenting with incomplete emptying of the bladder x3 days. She state that it feels like her urine has been "dripping out", and she feels as if she need to void again immediately after going to the bathroom. She denies dysuria, frequency, fever, chills, hesitancy, abdominal pain, back pain, N,V,D, and vaginal bleeding, itching, and pain. No dizziness or lightheadedness. She reports a thin vaginal discharge that is not malodorous over the last few weeks.  She reports she is currently [redacted] weeks pregnant and was evaluated by her OB earlier this week.  She was told she had a "tilted uterus" during her last visit. She reports she was treated for Chlamydia earlier in her pregnancy and has had no vaginal complaints since that time.  She reports that she drinks several glass of water daily. No treatment prior to arrival.   The history is provided by the patient. No language interpreter was used.    Past Medical History:  Diagnosis Date  . Constipation   . Hemorrhoid     Patient Active Problem List   Diagnosis Date Noted  . 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 Chronic pain of right knee 05/13/2016  . Tobacco use 01/11/2016  . 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 Allergic rhinitis 05/12/2011    Past Surgical History:  Procedure Laterality Date  . Hemorroid Removal    . HERNIA REPAIR    . NOSE SURGERY      OB History    Gravida Para Term Preterm AB Living   2             SAB TAB Ectopic Multiple Live Births                   Home Medications    Prior to Admission medications   Not on File    Family History No family history on file.  Social History Social History    Substance Use Topics  . Smoking status: Never Smoker  . Smokeless tobacco: Never Used  . Alcohol use No     Allergies   Patient has no known allergies.   Review of Systems Review of Systems  Constitutional: Negative for activity change, chills and fever.  Respiratory: Negative for shortness of breath.   Cardiovascular: Negative for chest pain.  Gastrointestinal: Negative for abdominal distention, abdominal pain, blood in stool, diarrhea, nausea and vomiting.  Genitourinary: Negative for decreased urine volume, dysuria, flank pain, menstrual problem, urgency, vaginal bleeding, vaginal discharge and vaginal pain.       Incomplete bladder emptying  Musculoskeletal: Negative for back pain.  Skin: Negative for rash.  Neurological: Negative for dizziness, light-headedness and headaches.     Physical Exam Updated Vital Signs BP 114/73 (BP Location: Left Arm)   Pulse 82   Temp 98.2 F (36.8 C) (Oral)   Resp 16   Ht 5\' 11"  (1.803 m)   Wt 74.8 kg (164 lb 14.5 oz)   LMP 05/06/2017   SpO2 98%   BMI 23.00 kg/m   Physical Exam  Constitutional: She is oriented to person, place, and time. She appears well-developed and well-nourished. No distress.  HENT:  Head: Normocephalic and atraumatic.  Mucous membranes are moist.  Eyes: Conjunctivae are normal.  Neck: Normal range of motion. Neck supple.  Cardiovascular: Normal rate, regular rhythm, normal heart sounds and intact distal pulses.  Exam reveals no gallop and no friction rub.   No murmur heard. Pulmonary/Chest: Effort normal and breath sounds normal. No respiratory distress. She has no wheezes. She has no rales. She exhibits no tenderness.  Abdominal: Soft. Bowel sounds are normal. She exhibits no distension and no mass. There is no tenderness. There is no rebound and no guarding. No hernia.  No CVA tenderness bilaterally.  Musculoskeletal: Normal range of motion.  Neurological: She is alert and oriented to person, place, and  time.  Skin: Skin is warm. Capillary refill takes less than 2 seconds. No rash noted. She is not diaphoretic.  Psychiatric: Her behavior is normal.  Nursing note and vitals reviewed.  ED Treatments / Results  Labs (all labs ordered are listed, but only abnormal results are displayed) Labs Reviewed  URINALYSIS, ROUTINE W REFLEX MICROSCOPIC - Abnormal; Notable for the following:       Result Value   APPearance HAZY (*)    Specific Gravity, Urine >1.030 (*)    Ketones, ur 15 (*)    All other components within normal limits    EKG  EKG Interpretation None       Radiology No results found.  Procedures Procedures (including critical care time)  Medications Ordered in ED Medications  sodium chloride 0.9 % bolus 1,000 mL (0 mLs Intravenous Stopped 07/31/17 1652)     Initial Impression / Assessment and Plan / ED Course  I have reviewed the triage vital signs and the nursing notes.  Pertinent labs & imaging results that were available during my care of the patient were reviewed by me and considered in my medical decision making (see chart for details).     This patient is a 21 year old G46P0 female who is currently 12 weeks into her pregnancy.  She presents with complaints of sensation of incomplete bladder emptying. Discussed the patient with Dr. Rogene Houston, attending physician. No dysuria, hematuria, frequency, hesitancy, or vaginal bleeding. No abdominal or back pain.  UA does not demonstrate bacteria, but appears consistent with dehydration. Bladder scan with 109 mLs.  Discussed with the patient the need for increased fluid intake during pregnancy to avoid dehydration. Rechecked the patient after a one-liter fluid bolus, and she states she was able to void and her symptoms are much improved. Discussed that she can follow up with her OBGYN. Strict return precautions given. NAD. VSS.  The patient is safe for discharge at this time.  Final Clinical Impressions(s) / ED Diagnoses    Final diagnoses:  Feeling of incomplete bladder emptying  Dehydration    New Prescriptions New Prescriptions   No medications on file     [name], PA-C 07/31/17 1712    Fredia Sorrow, MD 08/01/17 [phone]

## 2017-07-31 NOTE — Discharge Instructions (Signed)
When you are pregnant, you require much more water and fluid intake then when you are not pregnant to avoid getting dehydrated.  If you continue feel as if your bladder continues not to feel as if it is not fully emptying, please follow-up with your OB/GYN.  If you develop new symptoms such as vomiting, diarrhea, vaginal bleeding malodorous discharge, or pain, or feeling as if you might pass out or lightheadedness, please return to the emergency department for reevaluation.

## 2017-10-03 NOTE — L&D Delivery Note (Signed)
Pt was admitted with PTL. She progressed to C/C/+1 without difficulty.She had AROM with clear fluid. She had a SVD of one live viable female infant over a second degree midline tear. EBL-400cc. Tear closed with 3-0 chromic. Placenta-S/I  Baby delivered ROA

## 2018-01-17 ENCOUNTER — Inpatient Hospital Stay (HOSPITAL_COMMUNITY): Payer: BLUE CROSS/BLUE SHIELD | Admitting: Anesthesiology

## 2018-01-17 ENCOUNTER — Encounter (HOSPITAL_COMMUNITY): Payer: Self-pay | Admitting: *Deleted

## 2018-01-17 ENCOUNTER — Inpatient Hospital Stay (HOSPITAL_COMMUNITY)
Admission: AD | Admit: 2018-01-17 | Discharge: 2018-01-19 | DRG: 807 | Disposition: A | Discharge status: Home / Self Care | Payer: BLUE CROSS/BLUE SHIELD | Attending: Obstetrics and Gynecology | Admitting: Obstetrics and Gynecology

## 2018-01-17 DIAGNOSIS — Z3490 Encounter for supervision of normal pregnancy, unspecified, unspecified trimester: Secondary | ICD-10-CM

## 2018-01-17 DIAGNOSIS — Z3A36 36 weeks gestation of pregnancy: Secondary | ICD-10-CM | POA: Diagnosis not present

## 2018-01-17 LAB — CBC
HCT: 34.3 % — ABNORMAL LOW (ref 36.0–46.0)
Hemoglobin: 11.6 g/dL — ABNORMAL LOW (ref 12.0–15.0)
MCH: 27.8 pg (ref 26.0–34.0)
MCHC: 33.8 g/dL (ref 30.0–36.0)
MCV: 82.1 fL (ref 78.0–100.0)
PLATELETS: 245 10*3/uL (ref 150–400)
RBC: 4.18 MIL/uL (ref 3.87–5.11)
RDW: 14.2 % (ref 11.5–15.5)
WBC: 12.9 10*3/uL — AB (ref 4.0–10.5)

## 2018-01-17 LAB — GROUP B STREP BY PCR: GROUP B STREP BY PCR: NEGATIVE

## 2018-01-17 LAB — TYPE AND SCREEN
ABO/RH(D): A POS
Antibody Screen: NEGATIVE

## 2018-01-17 LAB — ABO/RH: ABO/RH(D): A POS

## 2018-01-17 MED ORDER — NIFEDIPINE 10 MG PO CAPS
10.0000 mg | ORAL_CAPSULE | Freq: Once | ORAL | Status: AC
  Administered 2018-01-17: 10 mg via ORAL
  Filled 2018-01-17: qty 1

## 2018-01-17 MED ORDER — EPHEDRINE 5 MG/ML INJ
10.0000 mg | INTRAVENOUS | Status: DC | PRN
  Filled 2018-01-17: qty 2

## 2018-01-17 MED ORDER — ZOLPIDEM TARTRATE 5 MG PO TABS: 5.0000 mg | ORAL_TABLET | Freq: Every evening | ORAL | Status: DC | PRN

## 2018-01-17 MED ORDER — IBUPROFEN 600 MG PO TABS
600.0000 mg | ORAL_TABLET | Freq: Four times a day (QID) | ORAL | Status: DC
  Administered 2018-01-17 – 2018-01-19 (×8): 600 mg via ORAL
  Filled 2018-01-17 (×8): qty 1

## 2018-01-17 MED ORDER — FLEET ENEMA 7-19 GM/118ML RE ENEM: 1.0000 | ENEMA | RECTAL | Status: DC | PRN

## 2018-01-17 MED ORDER — PHENYLEPHRINE 40 MCG/ML (10ML) SYRINGE FOR IV PUSH (FOR BLOOD PRESSURE SUPPORT)
80.0000 ug | PREFILLED_SYRINGE | INTRAVENOUS | Status: DC | PRN
  Filled 2018-01-17: qty 5

## 2018-01-17 MED ORDER — WITCH HAZEL-GLYCERIN EX PADS
1.0000 "application " | MEDICATED_PAD | CUTANEOUS | Status: DC | PRN
  Administered 2018-01-17 – 2018-01-19 (×2): 1 via TOPICAL

## 2018-01-17 MED ORDER — OXYCODONE-ACETAMINOPHEN 5-325 MG PO TABS: 1.0000 | ORAL_TABLET | ORAL | Status: DC | PRN

## 2018-01-17 MED ORDER — LIDOCAINE HCL (PF) 1 % IJ SOLN
30.0000 mL | INTRAMUSCULAR | Status: AC | PRN
  Administered 2018-01-17: 30 mL via SUBCUTANEOUS
  Filled 2018-01-17: qty 30

## 2018-01-17 MED ORDER — MEASLES, MUMPS & RUBELLA VAC ~~LOC~~ INJ: 0.5000 mL | INJECTION | Freq: Once | SUBCUTANEOUS | Status: DC

## 2018-01-17 MED ORDER — DIPHENHYDRAMINE HCL 50 MG/ML IJ SOLN
12.5000 mg | INTRAMUSCULAR | Status: DC | PRN
  Administered 2018-01-17: 12.5 mg via INTRAVENOUS
  Filled 2018-01-17: qty 1

## 2018-01-17 MED ORDER — LACTATED RINGERS IV SOLN: 500.0000 mL | Freq: Once | INTRAVENOUS | Status: DC

## 2018-01-17 MED ORDER — TETANUS-DIPHTH-ACELL PERTUSSIS 5-2.5-18.5 LF-MCG/0.5 IM SUSP
0.5000 mL | Freq: Once | INTRAMUSCULAR | Status: AC
  Administered 2018-01-18: 0.5 mL via INTRAMUSCULAR
  Filled 2018-01-17: qty 0.5

## 2018-01-17 MED ORDER — ONDANSETRON HCL 4 MG/2ML IJ SOLN: 4.0000 mg | Freq: Four times a day (QID) | INTRAMUSCULAR | Status: DC | PRN

## 2018-01-17 MED ORDER — ONDANSETRON HCL 4 MG/2ML IJ SOLN: 4.0000 mg | INTRAMUSCULAR | Status: DC | PRN

## 2018-01-17 MED ORDER — OXYTOCIN 40 UNITS IN LACTATED RINGERS INFUSION - SIMPLE MED
2.5000 [IU]/h | INTRAVENOUS | Status: DC
  Filled 2018-01-17: qty 1000

## 2018-01-17 MED ORDER — OXYTOCIN BOLUS FROM INFUSION
500.0000 mL | Freq: Once | INTRAVENOUS | Status: AC
  Administered 2018-01-17: 500 mL via INTRAVENOUS

## 2018-01-17 MED ORDER — PHENYLEPHRINE 40 MCG/ML (10ML) SYRINGE FOR IV PUSH (FOR BLOOD PRESSURE SUPPORT)
80.0000 ug | PREFILLED_SYRINGE | INTRAVENOUS | Status: DC | PRN
  Filled 2018-01-17: qty 5
  Filled 2018-01-17: qty 10

## 2018-01-17 MED ORDER — LIDOCAINE HCL (PF) 1 % IJ SOLN
INTRAMUSCULAR | Status: DC | PRN
  Administered 2018-01-17 (×2): 5 mL via EPIDURAL

## 2018-01-17 MED ORDER — ONDANSETRON HCL 4 MG PO TABS: 4.0000 mg | ORAL_TABLET | ORAL | Status: DC | PRN

## 2018-01-17 MED ORDER — EPHEDRINE 5 MG/ML INJ
10.0000 mg | INTRAVENOUS | Status: DC | PRN
Start: 2018-01-17 — End: 2018-01-17
  Filled 2018-01-17: qty 2

## 2018-01-17 MED ORDER — DIBUCAINE 1 % RE OINT
1.0000 "application " | TOPICAL_OINTMENT | RECTAL | Status: DC | PRN
  Administered 2018-01-18: 1 via RECTAL
  Filled 2018-01-17: qty 28

## 2018-01-17 MED ORDER — BENZOCAINE-MENTHOL 20-0.5 % EX AERO
1.0000 "application " | INHALATION_SPRAY | CUTANEOUS | Status: DC | PRN
  Administered 2018-01-17 – 2018-01-19 (×3): 1 via TOPICAL
  Filled 2018-01-17 (×3): qty 56

## 2018-01-17 MED ORDER — LACTATED RINGERS IV SOLN
500.0000 mL | INTRAVENOUS | Status: DC | PRN
  Administered 2018-01-17: 500 mL via INTRAVENOUS

## 2018-01-17 MED ORDER — ACETAMINOPHEN 325 MG PO TABS: 650.0000 mg | ORAL_TABLET | ORAL | Status: DC | PRN

## 2018-01-17 MED ORDER — SOD CITRATE-CITRIC ACID 500-334 MG/5ML PO SOLN: 30.0000 mL | ORAL | Status: DC | PRN

## 2018-01-17 MED ORDER — SIMETHICONE 80 MG PO CHEW: 80.0000 mg | CHEWABLE_TABLET | ORAL | Status: DC | PRN

## 2018-01-17 MED ORDER — OXYCODONE-ACETAMINOPHEN 5-325 MG PO TABS: 2.0000 | ORAL_TABLET | ORAL | Status: DC | PRN

## 2018-01-17 MED ORDER — LACTATED RINGERS IV SOLN
INTRAVENOUS | Status: DC
  Administered 2018-01-17: 10:00:00 via INTRAVENOUS

## 2018-01-17 MED ORDER — ACETAMINOPHEN 325 MG PO TABS
650.0000 mg | ORAL_TABLET | ORAL | Status: DC | PRN
  Administered 2018-01-18: 650 mg via ORAL
  Filled 2018-01-17: qty 2

## 2018-01-17 MED ORDER — COCONUT OIL OIL: 1.0000 "application " | TOPICAL_OIL | Status: DC | PRN

## 2018-01-17 MED ORDER — SENNOSIDES-DOCUSATE SODIUM 8.6-50 MG PO TABS
2.0000 | ORAL_TABLET | ORAL | Status: DC
  Administered 2018-01-18 (×2): 2 via ORAL
  Filled 2018-01-17 (×2): qty 2

## 2018-01-17 MED ORDER — LACTATED RINGERS IV SOLN
500.0000 mL | Freq: Once | INTRAVENOUS | Status: AC
  Administered 2018-01-17: 500 mL via INTRAVENOUS

## 2018-01-17 MED ORDER — FENTANYL 2.5 MCG/ML BUPIVACAINE 1/10 % EPIDURAL INFUSION (WH - ANES)
14.0000 mL/h | INTRAMUSCULAR | Status: DC | PRN
  Administered 2018-01-17: 14 mL/h via EPIDURAL
  Filled 2018-01-17: qty 100

## 2018-01-17 NOTE — Lactation Note (Signed)
This note was copied from a baby's chart. Lactation Consultation Note  Patient Name: Gabrielle Brooks Today's Date: 01/17/2018   Per Sharyn Lull, RN, Mom is not interested in putting baby to breast. Mom wants to pump & BO. RN has set her up w/a DEBP.    Matthias Hughs [facility] 01/17/2018, 10:52 PM

## 2018-01-17 NOTE — H&P (Signed)
ADDYLIN MANKE is an 22 y.o. G1P0000 [redacted]w[redacted]d black  female who was admitted with PTL. PNC was uncomplicated. She was txd for chlamydia early in preg.  Nl OGTT and genetic screening  Chief Complaint: HPI:  Past Medical History:  Diagnosis Date  . Constipation   . Hemorrhoid     Past Surgical History:  Procedure Laterality Date  . Hemorroid Removal    . HERNIA REPAIR    . NOSE SURGERY      Family History  Problem Relation Age of Onset  . ADD / ADHD Neg Hx   . Alcohol abuse Neg Hx   . Anxiety disorder Neg Hx   . Arthritis Neg Hx   . Asthma Neg Hx   . Birth defects Neg Hx   . Cancer Neg Hx   . COPD Neg Hx   . Depression Neg Hx   . Diabetes Neg Hx   . Drug abuse Neg Hx   . Early death Neg Hx   . Hearing loss Neg Hx   . Heart disease Neg Hx   . Kidney disease Neg Hx   . Varicose Veins Neg Hx   . Vision loss Neg Hx   . Stroke Neg Hx   . Obesity Neg Hx   . Miscarriages / Stillbirths Neg Hx   . Learning disabilities Neg Hx   . Intellectual disability Neg Hx   . Hypertension Neg Hx   . Hyperlipidemia Neg Hx    Social History:  reports that she has never smoked. She has never used smokeless tobacco. She reports that she does not drink alcohol or use drugs.  Allergies: No Known Allergies  Medications Prior to Admission  Medication Sig Dispense Refill  . Prenatal Vit-Fe Fumarate-FA (PRENATAL MULTIVITAMIN) TABS tablet Take 1 tablet by mouth daily at 12 noon.         Blood pressure 120/74, pulse 91, temperature 98.6 F (37 C), temperature source Oral, resp. rate 16, height 5\' 11"  (1.803 m), weight 209 lb (94.8 kg), last menstrual period 05/06/2017, SpO2 100 %. General appearance: alert Abdomen: gravid, non tender   Lab Results  Component Value Date   WBC 12.9 (H) 01/17/2018   HGB 11.6 (L) 01/17/2018   HCT 34.3 (L) 01/17/2018   MCV 82.1 01/17/2018   PLT 245 01/17/2018    Patient Active Problem List   Diagnosis Date Noted  . Indication for care in labor or  delivery 01/17/2018  . 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 Chronic pain of right knee 05/13/2016  . Tobacco use 01/11/2016  . 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 Allergic rhinitis 05/12/2011   IMP/IUP at 36 weeks with PTL Plan/Admit         [facility] PCR  ANDERSON,MARK E 01/17/2018, 10:52 AM

## 2018-01-17 NOTE — H&P (Signed)
Pt now 8cm. AROM clear.

## 2018-01-17 NOTE — Anesthesia Procedure Notes (Signed)
Epidural Patient location during procedure: OB Start time: 01/17/2018 6:39 AM End time: 01/17/2018 7:01 AM  Staffing Anesthesiologist: Annye Asa, MD Performed: anesthesiologist   Preanesthetic Checklist Completed: patient identified, surgical consent, pre-op evaluation, timeout performed, IV checked, risks and benefits discussed and monitors and equipment checked  Epidural Patient position: sitting Prep: site prepped and draped and DuraPrep Patient monitoring: blood pressure, continuous pulse ox and heart rate Approach: midline Location: L3-L4 Injection technique: LOR air  Needle:  Needle type: Tuohy  Needle gauge: 17 G Needle length: 9 cm Needle insertion depth: 4.5 cm Catheter type: closed end flexible Catheter size: 19 Gauge Catheter at skin depth: 10 cm Test dose: negative  Assessment Events: blood not aspirated, injection not painful, no injection resistance, negative IV test and no paresthesia  Additional Notes Pt identified in Labor room.  Monitors applied. Working IV access confirmed. Sterile prep, drape lumbar spine.  1% lido local L 3,4.  #17ga Touhy LOR air at 4.5 cm L 3,4, cath in easily to 10 cm skin. Test dose OK, cath dosed and infusion begun.  Patient asymptomatic, VSS, no heme aspirated, tolerated well.  Jenita Seashore, MDReason for block:procedure for pain

## 2018-01-17 NOTE — Progress Notes (Signed)
Pt was admitted for obs last pm when she was having painful contractions. She was felt to be in labor therefore she was given an epidural. She has not changed her cx, now 2 cm, She is only having irregular contractions at this time. There is no indication for an induction. Given this, will obs for 2 more hours and if no change in cx will remove the epidural and discharge to home.

## 2018-01-17 NOTE — Anesthesia Preprocedure Evaluation (Addendum)
Anesthesia Evaluation  Patient identified by MRN, date of birth, ID band Patient awake    Reviewed: Allergy & Precautions, NPO status , Patient's Chart, lab work & pertinent test results  History of Anesthesia Complications Negative for: history of anesthetic complications  Airway Mallampati: I  TM Distance: >3 FB Neck ROM: Full    Dental  (+) Dental Advisory Given   Pulmonary neg pulmonary ROS,    breath sounds clear to auscultation       Cardiovascular negative cardio ROS   Rhythm:Regular Rate:Normal     Neuro/Psych negative neurological ROS     GI/Hepatic Neg liver ROS, GERD  ,  Endo/Other  negative endocrine ROS  Renal/GU negative Renal ROS     Musculoskeletal   Abdominal   Peds  Hematology Hb 11.6, plt 245k    Anesthesia Other Findings   Reproductive/Obstetrics                            Anesthesia Physical Anesthesia Plan  ASA: II  Anesthesia Plan: Epidural   Post-op Pain Management:    Induction:   PONV Risk Score and Plan: Treatment may vary due to age or medical condition  Airway Management Planned: Natural Airway  Additional Equipment:   Intra-op Plan:   Post-operative Plan:   Informed Consent: I have reviewed the patients History and Physical, chart, labs and discussed the procedure including the risks, benefits and alternatives for the proposed anesthesia with the patient or authorized representative who has indicated his/her understanding and acceptance.   Dental advisory given  Plan Discussed with: CRNA and Surgeon  Anesthesia Plan Comments: (Patient identified. Risks/Benefits/Options discussed with patient including but not limited to bleeding, infection, nerve damage, paralysis, failed block, incomplete pain control, headache, blood pressure changes, nausea, vomiting, reactions to medication both or allergic, itching and postpartum back pain. Confirmed  with bedside nurse the patient's most recent platelet count. Confirmed with patient that they are not currently taking any anticoagulation, have any bleeding history or any family history of bleeding disorders. Patient expressed understanding and wished to proceed. All questions were answered. )       Anesthesia Quick Evaluation

## 2018-01-17 NOTE — MAU Note (Signed)
PT SAYS HURT BAD SINCE 12MN.    [place].  IN OFFICE - 2-3 CM  TODAY  .   DENIES HSV AND MRSA. GBS-  UNSURE

## 2018-01-17 NOTE — Lactation Note (Signed)
This note was copied from a baby's chart. Lactation Consultation Note  Patient Name: Boy Meli Faley [id number] Date: 01/17/2018 Reason for consult: Initial assessment;1st time breastfeeding;Primapara;Late-preterm 4-36.6wks  G1P1 mother whose infant is now 47 hours old.    Introduced lactation services and explained/reviewed LPTI policy with mother.  Discussed the importance of waking infant for feeds at least 8-12 times/24 hours or earlier if infant shows feeding cues.  Reminded mother that many times infants at this age will not show feeding cues and to awaken infant if it has been 3 hours since last feeding.  Mother's preference is to breast and bottle feed.  She recently fed  Neosure 22 at 1720 and infant took 15 mls without difficulty.  Mom made aware of O/P services, breastfeeding support groups, community resources, and our phone # for post-discharge questions. Encouraged mother to call RN?[place] for assistance as needed.    Maternal Data Formula Feeding for Exclusion: No Has patient been taught Hand Expression?: No(Many visitors at present;will need to be taught)  Feeding Feeding Type: Formula Nipple Type: Slow - flow  LATCH Score Latch: Grasps breast easily, tongue down, lips flanged, rhythmical sucking.  Audible Swallowing: Spontaneous and intermittent  Type of Nipple: Flat  Comfort (Breast/Nipple): Soft / non-tender  Hold (Positioning): Full assist, staff holds infant at breast  LATCH Score: 7  Interventions    Lactation Tools Discussed/Used     Consult Status Consult Status: Follow-up Date: 01/18/18 Follow-up type: In-patient    Little Ishikawa 01/17/2018, 5:57 PM

## 2018-01-17 NOTE — Anesthesia Postprocedure Evaluation (Signed)
Anesthesia Post Note  Patient: Gabrielle Brooks  Procedure(s) Performed: AN AD HOC LABOR EPIDURAL     Patient location during evaluation: Mother Baby Anesthesia Type: Epidural Level of consciousness: awake and alert and oriented Pain management: satisfactory to patient Vital Signs Assessment: post-procedure vital signs reviewed and stable Respiratory status: respiratory function stable Cardiovascular status: stable Postop Assessment: no headache, no backache, epidural receding, patient able to bend at knees, no signs of nausea or vomiting and adequate PO intake Anesthetic complications: no    Last Vitals:  Vitals:   01/17/18 1608 01/17/18 1711  BP: 113/81 122/82  Pulse: 99 100  Resp: 16 20  Temp: 37.3 C 37.6 C  SpO2:  99%    Last Pain:  Vitals:   01/17/18 1814  TempSrc:   PainSc: 3    Pain Goal:                 [name]

## 2018-01-17 NOTE — Anesthesia Pain Management Evaluation Note (Signed)
  CRNA Pain Management Visit Note  Patient: Gabrielle Brooks, 22 y.o., female  "Hello I am a member of the anesthesia team at [facility]. We have an anesthesia team available at all times to provide care throughout the hospital, including epidural management and anesthesia for C-section. I don't know your plan for the delivery whether it a natural birth, water birth, IV sedation, nitrous supplementation, doula or epidural, but we want to meet your pain goals."   1.Was your pain managed to your expectations on prior hospitalizations?   No prior hospitalizations  2.What is your expectation for pain management during this hospitalization?     Epidural  3.How can we help you reach that goal? Maintain epidural.  Record the patient's initial score and the patient's pain goal.   Pain: 0  Pain Goal: 7 [facility] wants you to be able to say your pain was always managed very well.  Raydin Bielinski 01/17/2018

## 2018-01-18 LAB — CBC
HCT: 27.3 % — ABNORMAL LOW (ref 36.0–46.0)
Hemoglobin: 9 g/dL — ABNORMAL LOW (ref 12.0–15.0)
MCH: 27.2 pg (ref 26.0–34.0)
MCHC: 33 g/dL (ref 30.0–36.0)
MCV: 82.5 fL (ref 78.0–100.0)
Platelets: 198 K/uL (ref 150–400)
RBC: 3.31 MIL/uL — ABNORMAL LOW (ref 3.87–5.11)
RDW: 14.3 % (ref 11.5–15.5)
WBC: 16.3 K/uL — ABNORMAL HIGH (ref 4.0–10.5)

## 2018-01-18 LAB — RPR: RPR: NONREACTIVE

## 2018-01-18 MED ORDER — FERROUS SULFATE 325 (65 FE) MG PO TABS
325.0000 mg | ORAL_TABLET | Freq: Two times a day (BID) | ORAL | Status: DC
  Administered 2018-01-18 – 2018-01-19 (×2): 325 mg via ORAL
  Filled 2018-01-18 (×3): qty 1

## 2018-01-18 NOTE — Progress Notes (Signed)
CSW received consult for hx of marijuana use.  Referral was screened out due to the following: ~MOB had no documented substance use after initial prenatal visit/+UPT. ~MOB had no positive drug screens after initial prenatal visit/+UPT. ~Baby's UDS is negative.  Please consult CSW if current concerns arise or by MOB's request.  CSW will monitor CDS results and make report to Child Protective Services if warranted.

## 2018-01-18 NOTE — Progress Notes (Signed)
Patient is eating, ambulating, voiding.  Pain control is good.  Vitals:   01/17/18 1608 01/17/18 1711 01/17/18 2137 01/18/18 0500  BP: 113/81 122/82 111/75 107/66  Pulse: 99 100 92 68  Resp: 16 20 20 16   Temp: 99.1 F (37.3 C) 99.6 F (37.6 C) 98.6 F (37 C) 98.1 F (36.7 C)  TempSrc: Oral Oral Oral Oral  SpO2:  99% 99%   Weight:      Height:        Fundus firm Perineum without swelling.  Lab Results  Component Value Date   WBC 16.3 (H) 01/18/2018   HGB 9.0 (L) 01/18/2018   HCT 27.3 (L) 01/18/2018   MCV 82.5 01/18/2018   PLT 198 01/18/2018    --/--/A POS, A POS Performed at [facility], [address], [place], [place] [address]  (04/17 0448)/[place]  A/P Post partum day 1.  Routine care.  Expect d/c routine.    Kaoir Loree A

## 2018-01-19 MED ORDER — IBUPROFEN 600 MG PO TABS: 600.0000 mg | ORAL_TABLET | Freq: Four times a day (QID) | ORAL | 0 refills | Status: DC | PRN

## 2018-01-19 MED ORDER — ACETAMINOPHEN 325 MG PO TABS: 650.0000 mg | ORAL_TABLET | Freq: Four times a day (QID) | ORAL | 0 refills | Status: DC | PRN

## 2018-01-19 NOTE — Discharge Summary (Signed)
Obstetric Discharge Summary Reason for Admission: onset of labor Prenatal Procedures: none Intrapartum Procedures: spontaneous vaginal delivery Postpartum Procedures: none Complications-Operative and Postpartum: 2 degree perineal laceration Hemoglobin  Date Value Ref Range Status  01/18/2018 9.0 (L) 12.0 - 15.0 g/dL Final    Comment:    DELTA CHECK NOTED REPEATED TO VERIFY    HCT  Date Value Ref Range Status  01/18/2018 27.3 (L) 36.0 - 46.0 % Final    Physical Exam:  General: alert, cooperative and appears stated age Lochia: appropriate Uterine Fundus: firm DVT Evaluation: No evidence of DVT seen on physical exam. Negative Homan's sign.  Discharge Diagnoses: Preterm delivery due to preterm labor at 36 weeks  Discharge Information: Date: 01/19/2018 Activity: pelvic rest Diet: routine Medications: Ibuprofen and tylenol Condition: stable Instructions: refer to practice specific booklet Discharge to: home Follow-up Information    Gabrielle Millers, MD Follow up in 4 week(s).   Specialty:  Obstetrics and Gynecology Contact information: [place] [id number] [phone]           Newborn Data: Live born female  Birth Weight: 6 lb 0.7 oz (2740 g) APGAR: 8, 9  Newborn Delivery   Birth date/time:  01/17/2018 14:11:00 Delivery type:  Vaginal, Spontaneous     Home with mother.  [place] 01/19/2018, 8:47 AM

## 2018-01-19 NOTE — Progress Notes (Signed)
Patient is doing well.  She is ambulating, voiding, tolerating PO.  Pain control is good.  Lochia is appropriate  Vitals:   01/17/18 2137 01/18/18 0500 01/18/18 1856 01/19/18 0500  BP: 111/75 107/66 (!) 94/55 111/73  Pulse: 92 68 83 72  Resp: 20 16 17    Temp: 98.6 F (37 C) 98.1 F (36.7 C) 98.2 F (36.8 C) 98.4 F (36.9 C)  TempSrc: Oral Oral Oral Oral  SpO2: 99%     Weight:      Height:        NAD Fundus firm Ext: no edema  Lab Results  Component Value Date   WBC 16.3 (H) 01/18/2018   HGB 9.0 (L) 01/18/2018   HCT 27.3 (L) 01/18/2018   MCV 82.5 01/18/2018   PLT 198 01/18/2018    --/--/A POS, A POS Performed at [facility], [address], [place], [place] [address]  (04/17 0448)/[employer]Immun[employer]  A/P 22 y.o. G1P0101 PPD#2. Routine care.   Meeting all goals--d/c to home today.    [place]

## 2018-01-24 ENCOUNTER — Inpatient Hospital Stay (HOSPITAL_COMMUNITY)
Admission: AD | Admit: 2018-01-24 | Discharge: 2018-01-24 | Disposition: A | Discharge status: Home / Self Care | Payer: BLUE CROSS/BLUE SHIELD | Source: Ambulatory Visit | Attending: Obstetrics and Gynecology | Admitting: Obstetrics and Gynecology

## 2018-01-24 ENCOUNTER — Encounter (HOSPITAL_COMMUNITY): Payer: Self-pay

## 2018-01-24 ENCOUNTER — Other Ambulatory Visit: Payer: Self-pay

## 2018-01-24 DIAGNOSIS — D649 Anemia, unspecified: Secondary | ICD-10-CM

## 2018-01-24 DIAGNOSIS — N939 Abnormal uterine and vaginal bleeding, unspecified: Secondary | ICD-10-CM

## 2018-01-24 LAB — URINALYSIS, ROUTINE W REFLEX MICROSCOPIC
Bilirubin Urine: NEGATIVE
GLUCOSE, UA: NEGATIVE mg/dL
Ketones, ur: NEGATIVE mg/dL
NITRITE: NEGATIVE
PH: 6 (ref 5.0–8.0)
Protein, ur: 30 mg/dL — AB
RBC / HPF: >50 RBC/hpf — ABNORMAL HIGH (ref 0–5)
SPECIFIC GRAVITY, URINE: 1.026 (ref 1.005–1.030)

## 2018-01-24 LAB — CBC
HCT: 29.8 % — ABNORMAL LOW (ref 36.0–46.0)
HEMOGLOBIN: 9.7 g/dL — AB (ref 12.0–15.0)
MCH: 27.4 pg (ref 26.0–34.0)
MCHC: 32.6 g/dL (ref 30.0–36.0)
MCV: 84.2 fL (ref 78.0–100.0)
PLATELETS: 343 10*3/uL (ref 150–400)
RBC: 3.54 MIL/uL — ABNORMAL LOW (ref 3.87–5.11)
RDW: 14.6 % (ref 11.5–15.5)
WBC: 8.1 10*3/uL (ref 4.0–10.5)

## 2018-01-24 NOTE — MAU Provider Note (Addendum)
History     CSN: [id number]  Arrival date and time: 01/24/18 8657   First Provider Initiated Contact with Patient 01/24/18 2045      Chief Complaint  Patient presents with  . Vaginal Bleeding   HPI Gabrielle Brooks 22 y.o.  Postpartum x 7 days from vaginal blirth.  Today has had increased vaginal bleeding and then passed a golf ball sized clot and then the bleeding slowed down to the previous level.  Client is not breastfeeding.  Denies any abdominal pain and is not having problems with perineal pain.   OB History    Gravida  1   Para  1   Term  0   Preterm  1   AB      Living  1     SAB      TAB      Ectopic      Multiple  0   Live Births  1           Past Medical History:  Diagnosis Date  . Constipation   . Hemorrhoid     Past Surgical History:  Procedure Laterality Date  . Hemorroid Removal    . HERNIA REPAIR    . NOSE SURGERY      Family History  Problem Relation Age of Onset  . ADD / ADHD Neg Hx   . Alcohol abuse Neg Hx   . Anxiety disorder Neg Hx   . Arthritis Neg Hx   . Asthma Neg Hx   . Birth defects Neg Hx   . Cancer Neg Hx   . COPD Neg Hx   . Depression Neg Hx   . Diabetes Neg Hx   . Drug abuse Neg Hx   . Early death Neg Hx   . Hearing loss Neg Hx   . Heart disease Neg Hx   . Kidney disease Neg Hx   . Varicose Veins Neg Hx   . Vision loss Neg Hx   . Stroke Neg Hx   . Obesity Neg Hx   . Miscarriages / Stillbirths Neg Hx   . Learning disabilities Neg Hx   . Intellectual disability Neg Hx   . Hypertension Neg Hx   . Hyperlipidemia Neg Hx     Social History   Tobacco Use  . Smoking status: Never Smoker  . Smokeless tobacco: Never Used  Substance Use Topics  . Alcohol use: No  . Drug use: No    Allergies: No Known Allergies  Medications Prior to Admission  Medication Sig Dispense Refill Last Dose  . acetaminophen (TYLENOL) 325 MG tablet Take 2 tablets (650 mg total) by mouth every 6 (six) hours as needed (for  pain scale < 4). 30 tablet 0   . ibuprofen (ADVIL,MOTRIN) 600 MG tablet Take 1 tablet (600 mg total) by mouth every 6 (six) hours as needed for cramping. 60 tablet 0   . Prenatal Vit-Fe Fumarate-FA (PRENATAL MULTIVITAMIN) TABS tablet Take 1 tablet by mouth daily at 12 noon.   Past Week at Unknown time    Review of Systems  Constitutional: Negative for fever.  Gastrointestinal: Negative for abdominal pain.  Genitourinary: Positive for vaginal bleeding. Negative for dysuria.  Neurological: Negative for headaches.       No visual changes   Physical Exam   Blood pressure (!) 129/91, pulse 64, temperature 98.5 F (36.9 C), temperature source Oral, resp. rate 18, height 5\' 11"  (1.803 m), weight 201 lb (91.2 kg), unknown if  currently breastfeeding.  Physical Exam  Nursing note and vitals reviewed. Constitutional: She is oriented to person, place, and time. She appears well-developed and well-nourished.  HENT:  Head: Normocephalic.  Eyes: EOM are normal.  Neck: Neck supple.  GI: Soft. There is no tenderness. There is no rebound and no guarding.  Musculoskeletal: Normal range of motion.  Trace ankle edema  Neurological: She is alert and oriented to person, place, and time.  Skin: Skin is warm and dry.  Psychiatric: She has a normal mood and affect.   Vitals:   01/24/18 2036 01/24/18 2045  BP: 139/86 (!) 129/91  Pulse: 64 64  Resp:    Temp:       MAU Course  Procedures Results for orders placed or performed during the hospital encounter of 01/24/18 (from the past 24 hour(s))  Urinalysis, Routine w reflex microscopic     Status: Abnormal   Collection Time: 01/24/18  8:08 PM  Result Value Ref Range   Color, Urine YELLOW YELLOW   APPearance HAZY (A) CLEAR   Specific Gravity, Urine 1.026 1.005 - 1.030   pH 6.0 5.0 - 8.0   Glucose, UA NEGATIVE NEGATIVE mg/dL   Hgb urine dipstick LARGE (A) NEGATIVE   Bilirubin Urine NEGATIVE NEGATIVE   Ketones, ur NEGATIVE NEGATIVE mg/dL    Protein, ur 30 (A) NEGATIVE mg/dL   Nitrite NEGATIVE NEGATIVE   Leukocytes, UA MODERATE (A) NEGATIVE   RBC / HPF >50 (H) 0 - 5 RBC/hpf   WBC, UA >50 (H) 0 - 5 WBC/hpf   Bacteria, UA RARE (A) NONE SEEN   Squamous Epithelial / LPF 0-5 0 - 5   Mucus PRESENT   CBC     Status: Abnormal   Collection Time: 01/24/18  8:22 PM  Result Value Ref Range   WBC 8.1 4.0 - 10.5 K/uL   RBC 3.54 (L) 3.87 - 5.11 MIL/uL   Hemoglobin 9.7 (L) 12.0 - 15.0 g/dL   HCT 29.8 (L) 36.0 - 46.0 %   MCV 84.2 78.0 - 100.0 fL   MCH 27.4 26.0 - 34.0 pg   MCHC 32.6 30.0 - 36.0 g/dL   RDW 14.6 11.5 - 15.5 %   Platelets 343 150 - 400 K/uL   MDM Reviewed plan of care with Dr. Harrington Challenger by phone.  Will check several BPs and if less than 140/90, will discharge home.  Assessment and Plan  Postpartum vaginal bleeding Borderline BP  Anemia - improved from day of discharge  Plan Discharge home - hemoglobin is better than day of discharge.   Watch bleeding and call the office if it becomes heavy again, but expect the bleeding to slowly resolve over the next 2-3 weeks. Call the office or return to MAU if you develop worsening vaginal bleeding, headaches, visual changes or significantly worse edema. Drink at least 8 8-oz glasses of water every day. Take Tylenol 325 mg 2 tablets by mouth every 4 hours if needed for pain. Eat healthy meals.   Terri L Burleson 01/24/2018, 9:09 PM

## 2018-01-24 NOTE — Discharge Instructions (Signed)
Watch bleeding and call the office if it becomes heavy again, but expect the bleeding to slowly resolve over the next 2-3 weeks. Call the office or return to MAU if you develop worsening vaginal bleeding, headaches, visual changes or significantly worse edema. Drink at least 8 8-oz glasses of water every day. Take Tylenol 325 mg 2 tablets by mouth every 4 hours if needed for pain.

## 2018-01-24 NOTE — MAU Note (Signed)
Pt presents to MAU with c/o of heavy vaginal bleeding following a vaginal delivery 01/17/2018. Today has been the only day of heavier bleeding since delivery, past a golf ball sized clot earlier. Pt has felt weak today but denied dizziness.

## 2018-06-18 ENCOUNTER — Encounter (HOSPITAL_BASED_OUTPATIENT_CLINIC_OR_DEPARTMENT_OTHER): Payer: Self-pay

## 2018-06-18 ENCOUNTER — Other Ambulatory Visit: Payer: Self-pay

## 2018-06-18 ENCOUNTER — Emergency Department (HOSPITAL_BASED_OUTPATIENT_CLINIC_OR_DEPARTMENT_OTHER)
Admission: EM | Admit: 2018-06-18 | Discharge: 2018-06-18 | Disposition: A | Discharge status: Home / Self Care | Payer: BLUE CROSS/BLUE SHIELD | Attending: Emergency Medicine | Admitting: Emergency Medicine

## 2018-06-18 DIAGNOSIS — F17200 Nicotine dependence, unspecified, uncomplicated: Secondary | ICD-10-CM | POA: Diagnosis not present

## 2018-06-18 DIAGNOSIS — M546 Pain in thoracic spine: Secondary | ICD-10-CM | POA: Insufficient documentation

## 2018-06-18 DIAGNOSIS — F1210 Cannabis abuse, uncomplicated: Secondary | ICD-10-CM | POA: Insufficient documentation

## 2018-06-18 DIAGNOSIS — N898 Other specified noninflammatory disorders of vagina: Secondary | ICD-10-CM | POA: Diagnosis not present

## 2018-06-18 LAB — URINALYSIS, ROUTINE W REFLEX MICROSCOPIC
BILIRUBIN URINE: NEGATIVE
Glucose, UA: NEGATIVE mg/dL
Hgb urine dipstick: NEGATIVE
Ketones, ur: NEGATIVE mg/dL
LEUKOCYTES UA: NEGATIVE
NITRITE: NEGATIVE
Protein, ur: NEGATIVE mg/dL
pH: 5.5 (ref 5.0–8.0)

## 2018-06-18 LAB — WET PREP, GENITAL
Clue Cells Wet Prep HPF POC: NONE SEEN
Sperm: NONE SEEN
Trich, Wet Prep: NONE SEEN
Yeast Wet Prep HPF POC: NONE SEEN

## 2018-06-18 LAB — PREGNANCY, URINE: PREG TEST UR: NEGATIVE

## 2018-06-18 NOTE — ED Provider Notes (Signed)
[place] EMERGENCY DEPARTMENT Provider Note   CSN: [id number] Arrival date & time: 06/18/18  1509     History   Chief Complaint Chief Complaint  Patient presents with  . Vaginal Discharge    HPI Gabrielle Brooks is a 22 y.o. female who is previously healthy who presents with a 2 to 3-week history of vaginal discharge.  She denies any pain or abnormal vaginal bleeding.  She also denies any urinary symptoms.  She is sexually active and has a concern for STD exposure, although she states this feels like a yeast infection.  She has not tried anything over-the-counter.  She was seen at urgent care 2 weeks ago and had cultures done on urine, but has not heard back.  She denies any abdominal pain she also reports a 2-day history of back pain where she had an epidural 4 months ago before her vaginal delivery.  She reports it hurts with some movement and touching it.  She does like there is a knot there.  She denies any fevers, numbness or tingling, saddle anesthesia, loss of bowel or bladder control.  HPI  Past Medical History:  Diagnosis Date  . Constipation   . Hemorrhoid     Patient Active Problem List   Diagnosis Date Noted  . Indication for care in labor or delivery 01/17/2018  . Pregnancy 01/17/2018  . 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 Chronic pain of right knee 05/13/2016  . Tobacco use 01/11/2016  . 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 Allergic rhinitis 05/12/2011    Past Surgical History:  Procedure Laterality Date  . Hemorroid Removal    . HERNIA REPAIR    . NOSE SURGERY       OB History    Gravida  1   Para  1   Term  0   Preterm  1   AB      Living  1     SAB      TAB      Ectopic      Multiple  0   Live Births  1            Home Medications    Prior to Admission medications   Medication Sig Start Date End Date Taking? Authorizing Provider  acetaminophen (TYLENOL) 325  MG tablet Take 2 tablets (650 mg total) by mouth every 6 (six) hours as needed (for pain scale < 4). 01/19/18   Jerelyn Charles, MD  ibuprofen (ADVIL,MOTRIN) 600 MG tablet Take 1 tablet (600 mg total) by mouth every 6 (six) hours as needed for cramping. 01/19/18   Jerelyn Charles, MD  Prenatal Vit-Fe Fumarate-FA (PRENATAL MULTIVITAMIN) TABS tablet Take 1 tablet by mouth daily at 12 noon.    [provider]    Family History Family History  Problem Relation Age of Onset  . ADD / ADHD Neg Hx   . Alcohol abuse Neg Hx   . Anxiety disorder Neg Hx   . Arthritis Neg Hx   . Asthma Neg Hx   . Birth defects Neg Hx   . Cancer Neg Hx   . COPD Neg Hx   . Depression Neg Hx   . Diabetes Neg Hx   . Drug abuse Neg Hx   . Early death Neg Hx   . Hearing loss Neg Hx   . Heart disease Neg Hx   . Kidney disease Neg Hx   . Varicose  Veins Neg Hx   . Vision loss Neg Hx   . Stroke Neg Hx   . Obesity Neg Hx   . Miscarriages / Stillbirths Neg Hx   . Learning disabilities Neg Hx   . Intellectual disability Neg Hx   . Hypertension Neg Hx   . Hyperlipidemia Neg Hx     Social History Social History   Tobacco Use  . Smoking status: Current Every Day Smoker  . Smokeless tobacco: Never Used  Substance Use Topics  . Alcohol use: Yes    Comment: occ  . Drug use: Yes    Types: Marijuana     Allergies   Patient has no known allergies.   Review of Systems Review of Systems  Constitutional: Negative for chills and fever.  HENT: Negative for facial swelling and sore throat.   Respiratory: Negative for shortness of breath.   Cardiovascular: Negative for chest pain.  Gastrointestinal: Negative for abdominal pain, nausea and vomiting.  Genitourinary: Positive for vaginal discharge. Negative for dysuria, flank pain and vaginal bleeding.  Musculoskeletal: Positive for back pain.  Skin: Negative for rash and wound.  Neurological: Negative for numbness and headaches.  Psychiatric/Behavioral: The  patient is not nervous/anxious.      Physical Exam Updated Vital Signs BP (!) 131/101 (BP Location: Right Arm)   Pulse 92   Temp 98.7 F (37.1 C) (Oral)   Resp 18   Ht 5\' 11"  (1.803 m)   Wt 89.4 kg   LMP 05/22/2018   SpO2 100%   BMI 27.48 kg/m   Physical Exam  Constitutional: She appears well-developed and well-nourished. No distress.  HENT:  Head: Normocephalic and atraumatic.  Mouth/Throat: Oropharynx is clear and moist. No oropharyngeal exudate.  Eyes: Pupils are equal, round, and reactive to light. Conjunctivae are normal. Right eye exhibits no discharge. Left eye exhibits no discharge. No scleral icterus.  Neck: Normal range of motion. Neck supple. No thyromegaly present.  Cardiovascular: Normal rate, regular rhythm, normal heart sounds and intact distal pulses. Exam reveals no gallop and no friction rub.  No murmur heard. Pulmonary/Chest: Effort normal and breath sounds normal. No stridor. No respiratory distress. She has no wheezes. She has no rales.  Abdominal: Soft. Bowel sounds are normal. She exhibits no distension. There is no tenderness. There is no rebound and no guarding.  Genitourinary: Uterus normal. Pelvic exam was performed with patient supine. Cervix exhibits no motion tenderness. Right adnexum displays no tenderness. Left adnexum displays no tenderness. Vaginal discharge (cottage cheese like, white) found.  Musculoskeletal: She exhibits no edema.       Back:  5/5 strength to bilateral lower extremities, sensation intact  Lymphadenopathy:    She has no cervical adenopathy.  Neurological: She is alert. Coordination normal.  Skin: Skin is warm and dry. No rash noted. She is not diaphoretic. No pallor.  Psychiatric: She has a normal mood and affect.  Nursing note and vitals reviewed.    ED Treatments / Results  Labs (all labs ordered are listed, but only abnormal results are displayed) Labs Reviewed  WET PREP, GENITAL - Abnormal; Notable for the  following components:      Result Value   WBC, Wet Prep HPF POC FEW (*)    All other components within normal limits  URINALYSIS, ROUTINE W REFLEX MICROSCOPIC - Abnormal; Notable for the following components:   APPearance HAZY (*)    Specific Gravity, Urine >1.030 (*)    All other components within normal limits  URINE CULTURE  PREGNANCY, URINE  RPR  HIV ANTIBODY (ROUTINE TESTING W REFLEX)  GC/CHLAMYDIA PROBE AMP ([place]) NOT AT [facility]    EKG None  Radiology No results found.  Procedures Procedures (including critical care time)  Medications Ordered in ED Medications - No data to display   Initial Impression / Assessment and Plan / ED Course  I have reviewed the triage vital signs and the nursing notes.  Pertinent labs & imaging results that were available during my care of the patient were reviewed by me and considered in my medical decision making (see chart for details).     Patient with vaginal discharge x2 to 3 weeks.  Wet prep is negative except for a few white blood cells.  Patient would like to wait for pending GC/chlamydia treatment.  HIV and RPR also sent.  UA is negative.  Regarding patient's back pain, her pain is mild at this time and there are no signs of erythema or abscess.  There was no injury.  Normal neuro exam without focal deficits.  Patient is afebrile.  Low suspicion of epidural abscess at this time.  Supportive treatment discussed at this time including ice, NSAIDs, but patient given strict return precautions and advised to follow-up with PCP.  She understands and agrees with plan.  Patient vitals stable throughout ED course and discharged in satisfactory condition. I discussed patient case with Dr. Darl Householder who guided the patient's management and agrees with plan.   Final Clinical Impressions(s) / ED Diagnoses   Final diagnoses:  Vaginal discharge  Acute midline thoracic back pain    ED Discharge Orders    None       Frederica Kuster,  PA-C 06/18/18 1625    Drenda Freeze, MD 06/19/18 [phone]

## 2018-06-18 NOTE — ED Triage Notes (Signed)
C/o vaginal d/c x 2 weeks-NAD-steady gait

## 2018-06-18 NOTE — ED Notes (Signed)
Pt states vaginal dc x 2-3 weeks,  Starting to have an odor,  Went to an urgent care, had cultures done but have not heard back  Was given antibiotics in case she had a uti but she only took pills  Denies burning or pain w urination

## 2018-06-18 NOTE — Discharge Instructions (Addendum)
You will be called in 3 days if any of your tests return positive. In that case, please follow-up with the health department or your primary care provider for treatment and please make all of your sexual partners aware that they will need to be treated as well. If positive, abstain from intercourse for one week after treatment until you have both been treated. Use condoms in the future to help prevent sexually transmitted disease and unwanted pregnancy. You can go to the health department in the future for free STD testing.  Regarding your back, use ice 3-4 times daily alternating 20 minutes on, 20 minutes off.  You can take ibuprofen or Tylenol as prescribed over-the-counter, as needed for your pain.  If you develop any fever, increasing pain, redness, swelling, numbness or tingling in your legs or groin area, loss of bowel or bladder control, or any other concerning symptom, please return the emergency department.  If your symptoms continue or do not improve, but are unchanged, please follow-up with your doctor for further management.

## 2018-06-19 LAB — URINE CULTURE: Culture: NO GROWTH

## 2018-06-19 LAB — GC/CHLAMYDIA PROBE AMP (~~LOC~~) NOT AT ARMC
Chlamydia: NEGATIVE
Neisseria Gonorrhea: NEGATIVE

## 2018-06-19 LAB — RPR: RPR Ser Ql: NONREACTIVE

## 2018-06-19 LAB — HIV ANTIBODY (ROUTINE TESTING W REFLEX): HIV Screen 4th Generation wRfx: NONREACTIVE

## 2018-07-31 ENCOUNTER — Emergency Department (HOSPITAL_BASED_OUTPATIENT_CLINIC_OR_DEPARTMENT_OTHER)
Admission: EM | Admit: 2018-07-31 | Discharge: 2018-07-31 | Disposition: A | Discharge status: Home / Self Care | Payer: BLUE CROSS/BLUE SHIELD | Attending: Emergency Medicine | Admitting: Emergency Medicine

## 2018-07-31 ENCOUNTER — Encounter (HOSPITAL_BASED_OUTPATIENT_CLINIC_OR_DEPARTMENT_OTHER): Payer: Self-pay

## 2018-07-31 ENCOUNTER — Other Ambulatory Visit: Payer: Self-pay

## 2018-07-31 DIAGNOSIS — M25552 Pain in left hip: Secondary | ICD-10-CM

## 2018-07-31 DIAGNOSIS — F17200 Nicotine dependence, unspecified, uncomplicated: Secondary | ICD-10-CM | POA: Diagnosis not present
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
## 2018-08-18 ENCOUNTER — Encounter (HOSPITAL_COMMUNITY): Payer: Self-pay

## 2018-08-18 ENCOUNTER — Other Ambulatory Visit: Payer: Self-pay

## 2018-08-18 ENCOUNTER — Ambulatory Visit (HOSPITAL_COMMUNITY)
Admission: EM | Admit: 2018-08-18 | Discharge: 2018-08-18 | Disposition: A | Discharge status: Home / Self Care | Payer: BLUE CROSS/BLUE SHIELD | Attending: Family Medicine | Admitting: Family Medicine

## 2018-08-18 DIAGNOSIS — R110 Nausea: Secondary | ICD-10-CM | POA: Diagnosis not present

## 2018-08-18 DIAGNOSIS — R5383 Other fatigue: Secondary | ICD-10-CM

## 2018-08-18 LAB — POCT URINALYSIS DIP (DEVICE)
Bilirubin Urine: NEGATIVE
GLUCOSE, UA: NEGATIVE mg/dL
Hgb urine dipstick: NEGATIVE
Ketones, ur: 40 mg/dL — AB
LEUKOCYTES UA: NEGATIVE
Nitrite: NEGATIVE
Protein, ur: NEGATIVE mg/dL
Specific Gravity, Urine: 1.02 (ref 1.005–1.030)
UROBILINOGEN UA: 1 mg/dL (ref 0.0–1.0)
pH: 7 (ref 5.0–8.0)

## 2018-08-18 LAB — POCT I-STAT, CHEM 8
BUN: 15 mg/dL (ref 6–20)
Calcium, Ion: 1.17 mmol/L (ref 1.15–1.40)
Chloride: 104 mmol/L (ref 98–111)
Creatinine, Ser: 0.6 mg/dL (ref 0.44–1.00)
Glucose, Bld: 87 mg/dL (ref 70–99)
HEMATOCRIT: 39 % (ref 36.0–46.0)
Hemoglobin: 13.3 g/dL (ref 12.0–15.0)
Potassium: 3.9 mmol/L (ref 3.5–5.1)
SODIUM: 141 mmol/L (ref 135–145)
TCO2: 25 mmol/L (ref 22–32)

## 2018-08-18 LAB — POCT PREGNANCY, URINE: PREG TEST UR: NEGATIVE

## 2018-08-18 MED ORDER — ONDANSETRON 4 MG PO TBDP: 4.0000 mg | ORAL_TABLET | Freq: Three times a day (TID) | ORAL | 0 refills | Status: DC | PRN

## 2018-08-18 NOTE — ED Provider Notes (Signed)
[facility]    CSN: [id number] Arrival date & time: 08/18/18  1611     History   Chief Complaint Chief Complaint  Patient presents with  . Nausea    HPI Gabrielle Brooks is a 22 y.o. female history of allergic rhinitis, tobacco use, presenting today for evaluation of nausea and possible UTI.  Patient states that over the past week she has had nausea, occasional dizziness as well as has noticed increased frequency in urination.  She was concerned about UTI in relation to this.  She has had some mild abdominal pain, has related this to starting her cycle today.  She was also concerned as yesterday she had a health screening and was noted to have elevated blood sugar of around 133.  She denies vomiting.  Nausea has led to decreased appetite and oral intake.  Denies diarrhea.  Denies dysuria.  Denies vaginal discharge, itching or irritation.  Denies concern for STDs.  Patient does not use any form of birth control.  Dizziness is described as lightheadedness.  Denies sensation of syncope.  HPI  Past Medical History:  Diagnosis Date  . Constipation   . Hemorrhoid     Patient Active Problem List   Diagnosis Date Noted  . Indication for care in labor or delivery 01/17/2018  . Pregnancy 01/17/2018  . 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 Chronic pain of right knee 05/13/2016  . Tobacco use 01/11/2016  . 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 Allergic rhinitis 05/12/2011    Past Surgical History:  Procedure Laterality Date  . Hemorroid Removal    . HERNIA REPAIR    . NOSE SURGERY      OB History    Gravida  1   Para  1   Term  0   Preterm  1   AB      Living  1     SAB      TAB      Ectopic      Multiple  0   Live Births  1            Home Medications    Prior to Admission medications   Medication Sig Start Date End Date Taking? Authorizing Provider  ondansetron (ZOFRAN ODT) 4 MG  disintegrating tablet Take 1 tablet (4 mg total) by mouth every 8 (eight) hours as needed for nausea or vomiting. 08/18/18   Wieters, Elesa Hacker, PA-C    Family History Family History  Problem Relation Age of Onset  . ADD / ADHD Neg Hx   . Alcohol abuse Neg Hx   . Anxiety disorder Neg Hx   . Arthritis Neg Hx   . Asthma Neg Hx   . Birth defects Neg Hx   . Cancer Neg Hx   . COPD Neg Hx   . Depression Neg Hx   . Diabetes Neg Hx   . Drug abuse Neg Hx   . Early death Neg Hx   . Hearing loss Neg Hx   . Heart disease Neg Hx   . Kidney disease Neg Hx   . Varicose Veins Neg Hx   . Vision loss Neg Hx   . Stroke Neg Hx   . Obesity Neg Hx   . Miscarriages / Stillbirths Neg Hx   . Learning disabilities Neg Hx   . Intellectual disability Neg Hx   . Hypertension Neg Hx   . Hyperlipidemia Neg Hx  Social History Social History   Tobacco Use  . Smoking status: Current Every Day Smoker  . Smokeless tobacco: Never Used  Substance Use Topics  . Alcohol use: Yes    Comment: occ  . Drug use: Yes    Types: Marijuana     Allergies   Patient has no known allergies.   Review of Systems Review of Systems  Constitutional: Negative for fever.  Respiratory: Negative for shortness of breath.   Cardiovascular: Negative for chest pain.  Gastrointestinal: Positive for nausea. Negative for abdominal pain, diarrhea and vomiting.  Genitourinary: Positive for dysuria. Negative for flank pain, genital sores, hematuria, menstrual problem, vaginal bleeding, vaginal discharge and vaginal pain.  Musculoskeletal: Negative for back pain.  Skin: Negative for rash.  Neurological: Positive for dizziness. Negative for light-headedness and headaches.     Physical Exam Triage Vital Signs ED Triage Vitals  Enc Vitals Group     BP 08/18/18 1633 129/72     Pulse Rate 08/18/18 1633 69     Resp 08/18/18 1633 18     Temp 08/18/18 1633 99.1 F (37.3 C)     Temp Source 08/18/18 1633 Oral     SpO2  08/18/18 1633 100 %     Weight 08/18/18 1634 190 lb (86.2 kg)     Height --      Head Circumference --      Peak Flow --      Pain Score 08/18/18 1634 0     Pain Loc --      Pain Edu? --      Excl. in [place]? --    No data found.  Updated Vital Signs BP 129/72 (BP Location: Left Arm)   Pulse 69   Temp 99.1 F (37.3 C) (Oral)   Resp 18   Wt 190 lb (86.2 kg)   LMP 08/18/2018   SpO2 100%   BMI 26.50 kg/m   Visual Acuity Right Eye Distance:   Left Eye Distance:   Bilateral Distance:    Right Eye Near:   Left Eye Near:    Bilateral Near:     Physical Exam  Constitutional: She is oriented to person, place, and time. She appears well-developed and well-nourished. No distress.  HENT:  Head: Normocephalic and atraumatic.  Oral mucosa pink and moist, no tonsillar enlargement or exudate. Posterior pharynx patent and nonerythematous, no uvula deviation or swelling. Normal phonation.  Eyes: Pupils are equal, round, and reactive to light. Conjunctivae and EOM are normal.  Neck: Neck supple.  Cardiovascular: Normal rate and regular rhythm.  No murmur heard. Pulmonary/Chest: Effort normal and breath sounds normal. No respiratory distress.  Breathing comfortably at rest, CTABL, no wheezing, rales or other adventitious sounds auscultated  Abdominal: Soft. There is no tenderness.  Nontender to light and deep palpation throughout all 4 quadrants and epigastrium No CVA tenderness  Musculoskeletal: She exhibits no edema.  Neurological: She is alert and oriented to person, place, and time. Coordination normal.  Skin: Skin is warm and dry.  Psychiatric: She has a normal mood and affect.  Nursing note and vitals reviewed.    UC Treatments / Results  Labs (all labs ordered are listed, but only abnormal results are displayed) Labs Reviewed  POCT URINALYSIS DIP (DEVICE) - Abnormal; Notable for the following components:      Result Value   Ketones, ur 40 (*)    All other components  within normal limits  POCT PREGNANCY, URINE  POCT I-STAT, CHEM 8  EKG None  Radiology No results found.  Procedures Procedures (including critical care time)  Medications Ordered in UC Medications - No data to display  Initial Impression / Assessment and Plan / UC Course  I have reviewed the triage vital signs and the nursing notes.  Pertinent labs & imaging results that were available during my care of the patient were reviewed by me and considered in my medical decision making (see chart for details).    UA negative, did show small amount of ketones.  Otherwise unremarkable.  I-STAT normal.  Do not suspect underlying viral illness, pregnancy test negative.  At this time will treat symptomatically with Zofran.  Will have patient follow-up with PCP for further evaluation of nausea and fatigue if symptoms persisting.  If developing abdominal pain or worsening symptoms may follow-up here in emergency room.  Monitor diet and exercise.Discussed strict return precautions. Patient verbalized understanding and is agreeable with plan.  Final Clinical Impressions(s) / UC Diagnoses   Final diagnoses:  Nausea     Discharge Instructions     Your urine did not show any signs of infection Blood work was normal, all electrolytes, hemoglobin and glucose was normal. Your blood sugar was 87 which is within the normal range.  We like it to be in between 48 and 100.  Please drink plenty of fluids You may use Zofran as needed for nausea  Please continue to monitor symptoms, follow-up with your primary care if symptoms persisting Follow-up here in emergency room if symptoms worsening, developing abdominal pain, persistent vomiting, fevers    ED Prescriptions    Medication Sig Dispense Auth. Provider   ondansetron (ZOFRAN ODT) 4 MG disintegrating tablet Take 1 tablet (4 mg total) by mouth every 8 (eight) hours as needed for nausea or vomiting. 20 tablet Wieters, Barnard C, PA-C      Controlled Substance Prescriptions [place] Controlled Substance Registry consulted? Not Applicable   Janith Lima, [place] 08/18/18 1725

## 2018-08-18 NOTE — Discharge Instructions (Addendum)
Your urine did not show any signs of infection Blood work was normal, all electrolytes, hemoglobin and glucose was normal. Your blood sugar was 87 which is within the normal range.  We like it to be in between 19 and 100.  Please drink plenty of fluids You may use Zofran as needed for nausea  Please continue to monitor symptoms, follow-up with your primary care if symptoms persisting Follow-up here in emergency room if symptoms worsening, developing abdominal pain, persistent vomiting, fevers

## 2018-08-18 NOTE — ED Triage Notes (Signed)
Pt states she has been nauseous and thinks she may have a UTI x 1 week

## 2018-09-24 ENCOUNTER — Encounter: Payer: Self-pay | Admitting: Emergency Medicine

## 2018-09-24 ENCOUNTER — Ambulatory Visit
Admission: EM | Admit: 2018-09-24 | Discharge: 2018-09-24 | Disposition: A | Discharge status: Home / Self Care | Payer: BLUE CROSS/BLUE SHIELD | Attending: Family Medicine | Admitting: Family Medicine

## 2018-09-24 ENCOUNTER — Other Ambulatory Visit: Payer: Self-pay

## 2018-09-24 DIAGNOSIS — N760 Acute vaginitis: Secondary | ICD-10-CM | POA: Diagnosis not present

## 2018-09-24 DIAGNOSIS — A084 Viral intestinal infection, unspecified: Secondary | ICD-10-CM | POA: Diagnosis not present

## 2018-09-24 DIAGNOSIS — B9689 Other specified bacterial agents as the cause of diseases classified elsewhere: Secondary | ICD-10-CM | POA: Diagnosis not present

## 2018-09-24 LAB — URINALYSIS, COMPLETE (UACMP) WITH MICROSCOPIC
Bacteria, UA: NONE SEEN
Glucose, UA: NEGATIVE mg/dL
Hgb urine dipstick: NEGATIVE
Leukocytes, UA: NEGATIVE
Nitrite: NEGATIVE
PH: 6 (ref 5.0–8.0)
Protein, ur: 30 mg/dL — AB
RBC / HPF: NONE SEEN RBC/hpf (ref 0–5)

## 2018-09-24 LAB — WET PREP, GENITAL
SPERM: NONE SEEN
TRICH WET PREP: NONE SEEN
Yeast Wet Prep HPF POC: NONE SEEN

## 2018-09-24 LAB — CHLAMYDIA/NGC RT PCR (ARMC ONLY)
Chlamydia Tr: NOT DETECTED
N gonorrhoeae: NOT DETECTED

## 2018-09-24 MED ORDER — METRONIDAZOLE 500 MG PO TABS: 500.0000 mg | ORAL_TABLET | Freq: Two times a day (BID) | ORAL | 0 refills | Status: DC

## 2018-09-24 NOTE — Discharge Instructions (Signed)
Rest, fluids, over the counter imodium AD

## 2018-09-24 NOTE — ED Provider Notes (Signed)
[facility]    CSN: [id number] Arrival date & time: 09/24/18  1825     History   Chief Complaint Chief Complaint  Patient presents with  . Diarrhea  . Vaginal Discharge    HPI Gabrielle Brooks is a 22 y.o. female.   22 yo female with a c/o loose stools today associated with mild abdominal cramping and nausea. Denies any fevers, chills, vomiting, melena, hematochezia. States slight discomfort with urination and as well as some vaginal discharge.   The history is provided by the patient.    Past Medical History:  Diagnosis Date  . Constipation   . Hemorrhoid     Patient Active Problem List   Diagnosis Date Noted  . Indication for care in labor or delivery 01/17/2018  . Pregnancy 01/17/2018  . 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 Chronic pain of right knee 05/13/2016  . Tobacco use 01/11/2016  . 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 Allergic rhinitis 05/12/2011    Past Surgical History:  Procedure Laterality Date  . Hemorroid Removal    . HERNIA REPAIR    . NOSE SURGERY      OB History    Gravida  1   Para  1   Term  0   Preterm  1   AB      Living  1     SAB      TAB      Ectopic      Multiple  0   Live Births  1            Home Medications    Prior to Admission medications   Medication Sig Start Date End Date Taking? Authorizing Provider  metroNIDAZOLE (FLAGYL) 500 MG tablet Take 1 tablet (500 mg total) by mouth 2 (two) times daily. 09/24/18   Norval Gable, MD  ondansetron (ZOFRAN ODT) 4 MG disintegrating tablet Take 1 tablet (4 mg total) by mouth every 8 (eight) hours as needed for nausea or vomiting. 08/18/18   Wieters, Elesa Hacker, PA-C    Family History Family History  Problem Relation Age of Onset  . ADD / ADHD Neg Hx   . Alcohol abuse Neg Hx   . Anxiety disorder Neg Hx   . Arthritis Neg Hx   . Asthma Neg Hx   . Birth defects Neg Hx   . Cancer Neg Hx   .  COPD Neg Hx   . Depression Neg Hx   . Diabetes Neg Hx   . Drug abuse Neg Hx   . Early death Neg Hx   . Hearing loss Neg Hx   . Heart disease Neg Hx   . Kidney disease Neg Hx   . Varicose Veins Neg Hx   . Vision loss Neg Hx   . Stroke Neg Hx   . Obesity Neg Hx   . Miscarriages / Stillbirths Neg Hx   . Learning disabilities Neg Hx   . Intellectual disability Neg Hx   . Hypertension Neg Hx   . Hyperlipidemia Neg Hx     Social History Social History   Tobacco Use  . Smoking status: Current Every Day Smoker    Types: Cigarettes  . Smokeless tobacco: Never Used  Substance Use Topics  . Alcohol use: Yes    Comment: occ  . Drug use: Yes    Types: Marijuana     Allergies   Patient has no known allergies.  Review of Systems Review of Systems   Physical Exam Triage Vital Signs ED Triage Vitals  Enc Vitals Group     BP 09/24/18 1858 112/77     Pulse Rate 09/24/18 1858 69     Resp 09/24/18 1858 16     Temp 09/24/18 1858 98.1 F (36.7 C)     Temp Source 09/24/18 1858 Oral     SpO2 09/24/18 1858 100 %     Weight 09/24/18 1855 190 lb (86.2 kg)     Height 09/24/18 1855 5\' 11"  (1.803 m)     Head Circumference --      Peak Flow --      Pain Score 09/24/18 1854 3     Pain Loc --      Pain Edu? --      Excl. in [place]? --    No data found.  Updated Vital Signs BP 112/77 (BP Location: Left Arm)   Pulse 69   Temp 98.1 F (36.7 C) (Oral)   Resp 16   Ht 5\' 11"  (1.803 m)   Wt 86.2 kg   LMP 09/08/2018 (Approximate)   SpO2 100%   Breastfeeding No   BMI 26.50 kg/m   Visual Acuity Right Eye Distance:   Left Eye Distance:   Bilateral Distance:    Right Eye Near:   Left Eye Near:    Bilateral Near:     Physical Exam Vitals signs and nursing note reviewed.  Constitutional:      General: She is not in acute distress.    Appearance: She is well-developed. She is not diaphoretic.  Abdominal:     General: Bowel sounds are normal. There is no distension.      Palpations: Abdomen is soft. There is no mass.     Tenderness: There is no abdominal tenderness. There is no right CVA tenderness, left CVA tenderness, guarding or rebound.      UC Treatments / Results  Labs (all labs ordered are listed, but only abnormal results are displayed) Labs Reviewed  WET PREP, GENITAL - Abnormal; Notable for the following components:      Result Value   Clue Cells Wet Prep HPF POC PRESENT (*)    WBC, Wet Prep HPF POC FEW (*)    All other components within normal limits  URINALYSIS, COMPLETE (UACMP) WITH MICROSCOPIC - Abnormal; Notable for the following components:   APPearance HAZY (*)    Specific Gravity, Urine >1.030 (*)    Bilirubin Urine SMALL (*)    Ketones, ur TRACE (*)    Protein, ur 30 (*)    All other components within normal limits  CHLAMYDIA/NGC RT PCR [facility] ONLY)    EKG None  Radiology No results found.  Procedures Procedures (including critical care time)  Medications Ordered in UC Medications - No data to display  Initial Impression / Assessment and Plan / UC Course  I have reviewed the triage vital signs and the nursing notes.  Pertinent labs & imaging results that were available during my care of the patient were reviewed by me and considered in my medical decision making (see chart for details).      Final Clinical Impressions(s) / UC Diagnoses   Final diagnoses:  BV (bacterial vaginosis)  Viral gastroenteritis     Discharge Instructions     Rest, fluids, over the counter imodium AD    ED Prescriptions    Medication Sig Dispense Auth. Provider   metroNIDAZOLE (FLAGYL) 500 MG tablet Take 1  tablet (500 mg total) by mouth 2 (two) times daily. 14 tablet Norval Gable, MD     1. Lab results and diagnosis reviewed with patient 2. rx as per orders above; reviewed possible side effects, interactions, risks and benefits  3. Recommend supportive treatment as above 4. Follow-up prn if symptoms worsen or  don't improve   Controlled Substance Prescriptions [place] Controlled Substance Registry consulted? Not Applicable   Norval Gable, MD 09/24/18 1949

## 2018-09-24 NOTE — ED Triage Notes (Signed)
Patient c/o loose stools that started today.  Patient reports some stomach pain. Patient states that she would like to be tested for STD.  Patient reports some vaginal discharge with odor.

## 2018-11-07 ENCOUNTER — Encounter: Payer: Self-pay | Admitting: Emergency Medicine

## 2018-11-07 ENCOUNTER — Other Ambulatory Visit: Payer: Self-pay

## 2018-11-07 ENCOUNTER — Ambulatory Visit
Admission: EM | Admit: 2018-11-07 | Discharge: 2018-11-07 | Disposition: A | Discharge status: Home / Self Care | Payer: BLUE CROSS/BLUE SHIELD | Attending: Family Medicine | Admitting: Family Medicine

## 2018-11-07 DIAGNOSIS — J209 Acute bronchitis, unspecified: Secondary | ICD-10-CM

## 2018-11-07 DIAGNOSIS — R059 Cough, unspecified: Secondary | ICD-10-CM

## 2018-11-07 DIAGNOSIS — R05 Cough: Secondary | ICD-10-CM

## 2018-11-07 MED ORDER — AZITHROMYCIN 250 MG PO TABS: ORAL_TABLET | ORAL | 0 refills | Status: DC

## 2018-11-07 MED ORDER — PREDNISONE 20 MG PO TABS: 20.0000 mg | ORAL_TABLET | Freq: Every day | ORAL | 0 refills | Status: DC

## 2018-11-07 NOTE — ED Triage Notes (Signed)
Patient states she has had congestion for about 2 weeks and stomach pain for about a week.  Patient states she vomited this morning

## 2018-11-07 NOTE — ED Provider Notes (Signed)
[facility]    CSN: [id number] Arrival date & time: 11/07/18  1304     History   Chief Complaint Chief Complaint  Patient presents with  . Nasal Congestion    HPI Gabrielle Brooks is a 23 y.o. female.    URI  Presenting symptoms: congestion, cough and fatigue   Severity:  Moderate Onset quality:  Sudden Duration:  2 weeks Timing:  Constant Progression:  Unchanged Chronicity:  New Relieved by:  Nothing Ineffective treatments:  OTC medications Associated symptoms: wheezing   Risk factors: sick contacts  Chronic respiratory disease: unknown, however chronic smoker.     Past Medical History:  Diagnosis Date  . Constipation   . Hemorrhoid     Patient Active Problem List   Diagnosis Date Noted  . Indication for care in labor or delivery 01/17/2018  . Pregnancy 01/17/2018  . 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 Chronic pain of right knee 05/13/2016  . Tobacco use 01/11/2016  . 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 Allergic rhinitis 05/12/2011    Past Surgical History:  Procedure Laterality Date  . Hemorroid Removal    . HERNIA REPAIR    . NOSE SURGERY      OB History    Gravida  1   Para  1   Term  0   Preterm  1   AB      Living  1     SAB      TAB      Ectopic      Multiple  0   Live Births  1            Home Medications    Prior to Admission medications   Medication Sig Start Date End Date Taking? Authorizing Provider  azithromycin (ZITHROMAX Z-PAK) 250 MG tablet 2 tabs po once day 1, then 1 tab po qd for next 4 days 11/07/18   Norval Gable, MD  metroNIDAZOLE (FLAGYL) 500 MG tablet Take 1 tablet (500 mg total) by mouth 2 (two) times daily. 09/24/18   Norval Gable, MD  ondansetron (ZOFRAN ODT) 4 MG disintegrating tablet Take 1 tablet (4 mg total) by mouth every 8 (eight) hours as needed for nausea or vomiting. 08/18/18   Wieters, Hallie C, PA-C  predniSONE  (DELTASONE) 20 MG tablet Take 1 tablet (20 mg total) by mouth daily. 11/07/18   Norval Gable, MD    Family History Family History  Problem Relation Age of Onset  . ADD / ADHD Neg Hx   . Alcohol abuse Neg Hx   . Anxiety disorder Neg Hx   . Arthritis Neg Hx   . Asthma Neg Hx   . Birth defects Neg Hx   . Cancer Neg Hx   . COPD Neg Hx   . Depression Neg Hx   . Diabetes Neg Hx   . Drug abuse Neg Hx   . Early death Neg Hx   . Hearing loss Neg Hx   . Heart disease Neg Hx   . Kidney disease Neg Hx   . Varicose Veins Neg Hx   . Vision loss Neg Hx   . Stroke Neg Hx   . Obesity Neg Hx   . Miscarriages / Stillbirths Neg Hx   . Learning disabilities Neg Hx   . Intellectual disability Neg Hx   . Hypertension Neg Hx   . Hyperlipidemia Neg Hx     Social History Social  History   Tobacco Use  . Smoking status: Current Every Day Smoker    Types: Cigarettes  . Smokeless tobacco: Never Used  Substance Use Topics  . Alcohol use: Yes    Comment: occ  . Drug use: Yes    Types: Marijuana     Allergies   Patient has no known allergies.   Review of Systems Review of Systems  Constitutional: Positive for fatigue.  HENT: Positive for congestion.   Respiratory: Positive for cough and wheezing.      Physical Exam Triage Vital Signs ED Triage Vitals  Enc Vitals Group     BP 11/07/18 1321 100/76     Pulse Rate 11/07/18 1321 70     Resp 11/07/18 1321 16     Temp 11/07/18 1321 98.1 F (36.7 C)     Temp Source 11/07/18 1321 Oral     SpO2 11/07/18 1321 100 %     Weight 11/07/18 1324 190 lb (86.2 kg)     Height 11/07/18 1324 5\' 11"  (1.803 m)     Head Circumference --      Peak Flow --      Pain Score 11/07/18 1323 5     Pain Loc --      Pain Edu? --      Excl. in [place]? --    No data found.  Updated Vital Signs BP 100/76   Pulse 70   Temp 98.1 F (36.7 C) (Oral)   Resp 16   Ht 5\' 11"  (1.803 m)   Wt 86.2 kg   LMP 10/19/2018 (Within Weeks)   SpO2 100%   BMI 26.50 kg/m    Visual Acuity Right Eye Distance:   Left Eye Distance:   Bilateral Distance:    Right Eye Near:   Left Eye Near:    Bilateral Near:     Physical Exam Constitutional:      General: She is not in acute distress.    Appearance: She is not toxic-appearing or diaphoretic.  Cardiovascular:     Rate and Rhythm: Normal rate and regular rhythm.  Pulmonary:     Effort: Pulmonary effort is normal. No respiratory distress.     Breath sounds: Rhonchi present. No wheezing or rales.  Neurological:     Mental Status: She is alert.      UC Treatments / Results  Labs (all labs ordered are listed, but only abnormal results are displayed) Labs Reviewed - No data to display  EKG None  Radiology No results found.  Procedures Procedures (including critical care time)  Medications Ordered in UC Medications - No data to display  Initial Impression / Assessment and Plan / UC Course  I have reviewed the triage vital signs and the nursing notes.  Pertinent labs & imaging results that were available during my care of the patient were reviewed by me and considered in my medical decision making (see chart for details).      Final Clinical Impressions(s) / UC Diagnoses   Final diagnoses:  Cough  Acute bronchitis, unspecified organism    ED Prescriptions    Medication Sig Dispense Auth. Provider   azithromycin (ZITHROMAX Z-PAK) 250 MG tablet 2 tabs po once day 1, then 1 tab po qd for next 4 days 6 each Norval Gable, MD   predniSONE (DELTASONE) 20 MG tablet Take 1 tablet (20 mg total) by mouth daily. 5 tablet Norval Gable, MD     1. diagnosis reviewed with patient 2. rx as per  orders above; reviewed possible side effects, interactions, risks and benefits  3. Recommend supportive treatment with rest, fluids 4. Follow-up prn if symptoms worsen or don't improve   Controlled Substance Prescriptions [place] Controlled Substance Registry consulted? Not Applicable   Norval Gable,  MD 11/07/18 [phone]

## 2018-12-18 ENCOUNTER — Ambulatory Visit
Admission: EM | Admit: 2018-12-18 | Discharge: 2018-12-18 | Disposition: A | Discharge status: Home / Self Care | Payer: BLUE CROSS/BLUE SHIELD | Attending: Family Medicine | Admitting: Family Medicine

## 2018-12-18 DIAGNOSIS — N760 Acute vaginitis: Secondary | ICD-10-CM

## 2018-12-18 DIAGNOSIS — B9689 Other specified bacterial agents as the cause of diseases classified elsewhere: Secondary | ICD-10-CM

## 2018-12-18 LAB — CHLAMYDIA/NGC RT PCR (ARMC ONLY)
Chlamydia Tr: NOT DETECTED
N gonorrhoeae: NOT DETECTED

## 2018-12-18 LAB — WET PREP, GENITAL
SPERM: NONE SEEN
Trich, Wet Prep: NONE SEEN
Yeast Wet Prep HPF POC: NONE SEEN

## 2018-12-18 MED ORDER — METRONIDAZOLE 500 MG PO TABS: 500.0000 mg | ORAL_TABLET | Freq: Two times a day (BID) | ORAL | 0 refills | Status: DC

## 2018-12-18 NOTE — ED Triage Notes (Signed)
Pt here for vaginal odor for 3 days thought it was due to her period but odor still present. No abnormal discharge present or itching. Does want std testing.

## 2018-12-18 NOTE — ED Provider Notes (Signed)
[facility]    CSN: [id number] Arrival date & time: 12/18/18  1959  History   Chief Complaint Chief Complaint  Patient presents with  . Vaginitis    HPI  23 year old female presents with vaginal odor.  3-day history of vaginal odor.  She reports that she has a vaginal discharge but states that is normal.  Recent menstrual cycle.  Patient states that she wants STD testing as well.  No reports abdominal pain.  No fever.  No urinary symptoms.  No other associated symptoms.  No other complaints.  PMH, Surgical Hx, Family Hx, Social History reviewed and updated as below.  Past Medical History:  Diagnosis Date  . Constipation   . Hemorrhoid     Patient Active Problem List   Diagnosis Date Noted  . Indication for care in labor or delivery 01/17/2018  . Pregnancy 01/17/2018  . 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 Chronic pain of right knee 05/13/2016  . Tobacco use 01/11/2016  . 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 Allergic rhinitis 05/12/2011    Past Surgical History:  Procedure Laterality Date  . Hemorroid Removal    . HERNIA REPAIR    . NOSE SURGERY      OB History    Gravida  1   Para  1   Term  0   Preterm  1   AB      Living  1     SAB      TAB      Ectopic      Multiple  0   Live Births  1            Home Medications    Prior to Admission medications   Medication Sig Start Date End Date Taking? Authorizing Provider  metroNIDAZOLE (FLAGYL) 500 MG tablet Take 1 tablet (500 mg total) by mouth 2 (two) times daily. 12/18/18   Coral Spikes, DO    Family History Family History  Problem Relation Age of Onset  . ADD / ADHD Neg Hx   . Alcohol abuse Neg Hx   . Anxiety disorder Neg Hx   . Arthritis Neg Hx   . Asthma Neg Hx   . Birth defects Neg Hx   . Cancer Neg Hx   . COPD Neg Hx   . Depression Neg Hx   . Diabetes Neg Hx   . Drug abuse Neg Hx   . Early death Neg Hx   .  Hearing loss Neg Hx   . Heart disease Neg Hx   . Kidney disease Neg Hx   . Varicose Veins Neg Hx   . Vision loss Neg Hx   . Stroke Neg Hx   . Obesity Neg Hx   . Miscarriages / Stillbirths Neg Hx   . Learning disabilities Neg Hx   . Intellectual disability Neg Hx   . Hypertension Neg Hx   . Hyperlipidemia Neg Hx     Social History Social History   Tobacco Use  . Smoking status: Current Every Day Smoker    Types: Cigarettes  . Smokeless tobacco: Never Used  Substance Use Topics  . Alcohol use: Yes    Comment: occ  . Drug use: Yes    Types: Marijuana     Allergies   Patient has no known allergies.   Review of Systems Review of Systems  Constitutional: Negative.   Genitourinary: Positive for vaginal discharge.  Vaginal odor.   Physical Exam Triage Vital Signs ED Triage Vitals  Enc Vitals Group     BP 12/18/18 2006 114/76     Pulse Rate 12/18/18 2006 98     Resp 12/18/18 2006 18     Temp 12/18/18 2008 98.5 F (36.9 C)     Temp Source 12/18/18 2008 Oral     SpO2 12/18/18 2006 100 %     Weight 12/18/18 2008 180 lb (81.6 kg)     Height 12/18/18 2008 5\' 11"  (1.803 m)     Head Circumference --      Peak Flow --      Pain Score 12/18/18 2008 0     Pain Loc --      Pain Edu? --      Excl. in [place]? --    Updated Vital Signs BP 114/76 (BP Location: Right Arm)   Pulse 98   Temp 98.5 F (36.9 C) (Oral)   Resp 18   Ht 5\' 11"  (1.803 m)   Wt 81.6 kg   LMP 12/16/2018 (Exact Date)   SpO2 100%   Breastfeeding No   BMI 25.10 kg/m   Visual Acuity Right Eye Distance:   Left Eye Distance:   Bilateral Distance:    Right Eye Near:   Left Eye Near:    Bilateral Near:     Physical Exam Vitals signs and nursing note reviewed.  Constitutional:      General: She is not in acute distress.    Appearance: Normal appearance.  HENT:     Head: Normocephalic and atraumatic.  Eyes:     Comments: Conjunctival injection bilaterally.  Cardiovascular:     Rate and  Rhythm: Normal rate and regular rhythm.  Pulmonary:     Effort: Pulmonary effort is normal.     Breath sounds: Normal breath sounds.  Abdominal:     General: There is no distension.     Palpations: Abdomen is soft.     Tenderness: There is no abdominal tenderness.  Neurological:     Mental Status: She is alert.  Psychiatric:        Mood and Affect: Mood normal.        Behavior: Behavior normal.    UC Treatments / Results  Labs (all labs ordered are listed, but only abnormal results are displayed) Labs Reviewed  WET PREP, GENITAL - Abnormal; Notable for the following components:      Result Value   Clue Cells Wet Prep HPF POC PRESENT (*)    WBC, Wet Prep HPF POC RARE (*)    All other components within normal limits  CHLAMYDIA/NGC RT PCR ([facility] ONLY)    EKG None  Radiology No results found.  Procedures Procedures (including critical care time)  Medications Ordered in UC Medications - No data to display  Initial Impression / Assessment and Plan / UC Course  I have reviewed the triage vital signs and the nursing notes.  Pertinent labs & imaging results that were available during my care of the patient were reviewed by me and considered in my medical decision making (see chart for details).    23 year old female presents with vaginal odor.  Wet prep consistent with BV.  Started on Flagyl.  STD testing sent  Final Clinical Impressions(s) / UC Diagnoses   Final diagnoses:  Bacterial vaginosis   Discharge Instructions   None    ED Prescriptions    Medication Sig Dispense Auth. Provider   metroNIDAZOLE (FLAGYL) 500  MG tablet Take 1 tablet (500 mg total) by mouth 2 (two) times daily. 14 tablet Coral Spikes, DO     Controlled Substance Prescriptions [place] Controlled Substance Registry consulted? Not Applicable   Coral Spikes, DO 12/18/18 2025

## 2019-06-27 ENCOUNTER — Encounter: Payer: Self-pay | Admitting: Emergency Medicine

## 2019-06-27 ENCOUNTER — Ambulatory Visit
Admission: EM | Admit: 2019-06-27 | Discharge: 2019-06-27 | Disposition: A | Discharge status: Home / Self Care | Payer: Self-pay | Attending: Family Medicine | Admitting: Family Medicine

## 2019-06-27 ENCOUNTER — Other Ambulatory Visit: Payer: Self-pay

## 2019-06-27 DIAGNOSIS — H8110 Benign paroxysmal vertigo, unspecified ear: Secondary | ICD-10-CM

## 2019-06-27 MED ORDER — MECLIZINE HCL 25 MG PO TABS: 25.0000 mg | ORAL_TABLET | Freq: Three times a day (TID) | ORAL | 0 refills | Status: DC | PRN

## 2019-06-27 NOTE — ED Triage Notes (Signed)
Patient here today for dizziness/nausea states that while standing up last night fell against wall. Around 1 a.m

## 2019-06-27 NOTE — ED Provider Notes (Signed)
[facility]    CSN: [id number] Arrival date & time: 06/27/19  0807  History   Chief Complaint Chief Complaint  Patient presents with  . Dizziness  . Nausea   HPI  23 year old female presents with the above complaints.  Patient reports that she developed dizziness last night.  She reports that she feels like things are spinning.  She feels unsteady on her feet.  Associated nausea.  No medications were interventions tried.  Worse with movement.  Improves with lying down.  No vomiting.  No vision changes.  No reports of weakness, numbness, tingling.  Moderate in severity.  No other associated symptoms.  No other complaints.  PMH, Surgical Hx, Family Hx, Social History reviewed and updated as below.  Past Medical History:  Diagnosis Date  . Constipation   . Hemorrhoid    Patient Active Problem List   Diagnosis Date Noted  . Indication for care in labor or delivery 01/17/2018  . Pregnancy 01/17/2018  . 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 Chronic pain of right knee 05/13/2016  . Tobacco use 01/11/2016  . 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 Allergic rhinitis 05/12/2011   Past Surgical History:  Procedure Laterality Date  . Hemorroid Removal    . HERNIA REPAIR    . NOSE SURGERY     OB History    Gravida  1   Para  1   Term  0   Preterm  1   AB      Living  1     SAB      TAB      Ectopic      Multiple  0   Live Births  1          Home Medications    Prior to Admission medications   Medication Sig Start Date End Date Taking? Authorizing Provider  meclizine (ANTIVERT) 25 MG tablet Take 1 tablet (25 mg total) by mouth 3 (three) times daily as needed for dizziness. 06/27/19   Coral Spikes, DO    Family History Family History  Problem Relation Age of Onset  . ADD / ADHD Neg Hx   . Alcohol abuse Neg Hx   . Anxiety disorder Neg Hx   . Arthritis Neg Hx   . Asthma Neg Hx   . Birth  defects Neg Hx   . Cancer Neg Hx   . COPD Neg Hx   . Depression Neg Hx   . Diabetes Neg Hx   . Drug abuse Neg Hx   . Early death Neg Hx   . Hearing loss Neg Hx   . Heart disease Neg Hx   . Kidney disease Neg Hx   . Varicose Veins Neg Hx   . Vision loss Neg Hx   . Stroke Neg Hx   . Obesity Neg Hx   . Miscarriages / Stillbirths Neg Hx   . Learning disabilities Neg Hx   . Intellectual disability Neg Hx   . Hypertension Neg Hx   . Hyperlipidemia Neg Hx     Social History Social History   Tobacco Use  . Smoking status: Current Every Day Smoker    Types: Cigarettes  . Smokeless tobacco: Never Used  Substance Use Topics  . Alcohol use: Yes    Comment: occ  . Drug use: Yes    Types: Marijuana     Allergies   Patient has no known allergies.  Review of Systems Review of Systems  Gastrointestinal: Positive for nausea.  Neurological: Positive for dizziness.   Physical Exam Triage Vital Signs ED Triage Vitals  Enc Vitals Group     BP 06/27/19 0825 108/82     Pulse Rate 06/27/19 0825 77     Resp 06/27/19 0825 18     Temp 06/27/19 0825 98.5 F (36.9 C)     Temp src --      SpO2 06/27/19 0825 99 %     Weight --      Height 06/27/19 0822 5\' 11"  (1.803 m)     Head Circumference --      Peak Flow --      Pain Score 06/27/19 0822 0     Pain Loc --      Pain Edu? --      Excl. in [place]? --    Updated Vital Signs BP 108/82 (BP Location: Left Arm)   Pulse 77   Temp 98.5 F (36.9 C)   Resp 18   Ht 5\' 11"  (1.803 m)   LMP 06/13/2019   SpO2 99%   BMI 25.10 kg/m   Visual Acuity Right Eye Distance:   Left Eye Distance:   Bilateral Distance:    Right Eye Near:   Left Eye Near:    Bilateral Near:     Physical Exam Vitals signs and nursing note reviewed.  Constitutional:      General: She is not in acute distress.    Appearance: Normal appearance. She is not ill-appearing.  HENT:     Head: Normocephalic and atraumatic.  Eyes:     General:        Right eye:  No discharge.        Left eye: No discharge.     Extraocular Movements: Extraocular movements intact.     Conjunctiva/sclera: Conjunctivae normal.  Cardiovascular:     Rate and Rhythm: Normal rate and regular rhythm.     Heart sounds: No murmur.  Pulmonary:     Effort: Pulmonary effort is normal.     Breath sounds: Normal breath sounds. No wheezing, rhonchi or rales.  Neurological:     Mental Status: She is alert.  Psychiatric:        Mood and Affect: Mood normal.        Behavior: Behavior normal.    UC Treatments / Results  Labs (all labs ordered are listed, but only abnormal results are displayed) Labs Reviewed - No data to display  EKG   Radiology No results found.  Procedures Procedures (including critical care time)  Medications Ordered in UC Medications - No data to display  Initial Impression / Assessment and Plan / UC Course  I have reviewed the triage vital signs and the nursing notes.  Pertinent labs & imaging results that were available during my care of the patient were reviewed by me and considered in my medical decision making (see chart for details).    23 year old female presents with vertigo. Treating with meclizine. Advised home use of epley maneuver (handout given). Work note given.  Final Clinical Impressions(s) / UC Diagnoses   Final diagnoses:  Benign paroxysmal positional vertigo, unspecified laterality   Discharge Instructions   None    ED Prescriptions    Medication Sig Dispense Auth. Provider   meclizine (ANTIVERT) 25 MG tablet Take 1 tablet (25 mg total) by mouth 3 (three) times daily as needed for dizziness. 30 tablet [place], [place], [place]  PDMP not reviewed this encounter.   Thersa Salt [place], [place] 06/27/19 [phone]

## 2019-09-07 ENCOUNTER — Ambulatory Visit
Admission: EM | Admit: 2019-09-07 | Discharge: 2019-09-07 | Disposition: A | Discharge status: Home / Self Care | Payer: Self-pay | Attending: Emergency Medicine | Admitting: Emergency Medicine

## 2019-09-07 ENCOUNTER — Encounter: Payer: Self-pay | Admitting: Emergency Medicine

## 2019-09-07 ENCOUNTER — Other Ambulatory Visit: Payer: Self-pay

## 2019-09-07 DIAGNOSIS — M62838 Other muscle spasm: Secondary | ICD-10-CM

## 2019-09-07 MED ORDER — KETOROLAC TROMETHAMINE 60 MG/2ML IM SOLN
30.0000 mg | Freq: Once | INTRAMUSCULAR | Status: AC
  Administered 2019-09-07: 30 mg via INTRAMUSCULAR

## 2019-09-07 MED ORDER — TIZANIDINE HCL 4 MG PO TABS: 4.0000 mg | ORAL_TABLET | Freq: Three times a day (TID) | ORAL | 0 refills | Status: DC | PRN

## 2019-09-07 MED ORDER — IBUPROFEN 600 MG PO TABS: 600.0000 mg | ORAL_TABLET | Freq: Four times a day (QID) | ORAL | 0 refills | Status: DC | PRN

## 2019-09-07 NOTE — ED Triage Notes (Signed)
Patient states that she woke up this morning with pain, stiffness and spasms in her neck.  Patient denies fevers.  Patient denies cold symptoms.  Patient denies injury.

## 2019-09-07 NOTE — ED Provider Notes (Signed)
HPI  SUBJECTIVE:  Gabrielle Brooks is a 23 y.o. female who presents with constant right sided posterior neck pain/spasm starting this morning.  Becomes sharp with movement.  States that it feels "deep".  She thinks that she slept on her neck wrong.  States she cannot turn her head.  No fevers, sore throat, numbness or tingling in her hand, arm weakness.  No trauma to the neck.  No dental pain, dental infection.  No sensation of throat swelling shut, difficulty breathing, facial swelling.  No headache.  She tried Pamprin and heat.  The heat seemed to help.  Symptoms are worse when she tries to move her neck.  she has never had symptoms like this before.  She has no past medical history.  LMP: 2 weeks ago.  Denies possibility being pregnant.  [id number], Gordan Payment, PA   Past Medical History:  Diagnosis Date  . Constipation   . Hemorrhoid     Past Surgical History:  Procedure Laterality Date  . Hemorroid Removal    . HERNIA REPAIR    . NOSE SURGERY      Family History  Problem Relation Age of Onset  . Healthy Mother   . Healthy Father   . ADD / ADHD Neg Hx   . Alcohol abuse Neg Hx   . Anxiety disorder Neg Hx   . Arthritis Neg Hx   . Asthma Neg Hx   . Birth defects Neg Hx   . Cancer Neg Hx   . COPD Neg Hx   . Depression Neg Hx   . Diabetes Neg Hx   . Drug abuse Neg Hx   . Early death Neg Hx   . Hearing loss Neg Hx   . Heart disease Neg Hx   . Kidney disease Neg Hx   . Varicose Veins Neg Hx   . Vision loss Neg Hx   . Stroke Neg Hx   . Obesity Neg Hx   . Miscarriages / Stillbirths Neg Hx   . Learning disabilities Neg Hx   . Intellectual disability Neg Hx   . Hypertension Neg Hx   . Hyperlipidemia Neg Hx     Social History   Tobacco Use  . Smoking status: Former Smoker    Types: Cigarettes  . Smokeless tobacco: Never Used  Substance Use Topics  . Alcohol use: Yes    Comment: occ  . Drug use: Yes    Types: Marijuana     Current Facility-Administered  Medications:  .  ketorolac (TORADOL) injection 30 mg, 30 mg, Intramuscular, Once, Melynda Ripple, MD  Current Outpatient Medications:  .  ibuprofen (ADVIL) 600 MG tablet, Take 1 tablet (600 mg total) by mouth every 6 (six) hours as needed., Disp: 30 tablet, Rfl: 0 .  tiZANidine (ZANAFLEX) 4 MG tablet, Take 1 tablet (4 mg total) by mouth every 8 (eight) hours as needed for muscle spasms., Disp: 30 tablet, Rfl: 0  No Known Allergies   ROS  As noted in HPI.   Physical Exam  BP 114/76 (BP Location: Right Arm)   Pulse 66   Temp 98.2 F (36.8 C) (Oral)   Resp 14   Ht 5\' 11"  (1.803 m)   Wt 72.6 kg   LMP 08/24/2019   SpO2 100%   Breastfeeding No   BMI 22.32 kg/m   Constitutional: Well developed, well nourished, no acute distress Eyes:  EOMI, conjunctiva normal bilaterally HENT: Normocephalic, atraumatic,mucus membranes moist Respiratory: Normal inspiratory effort Cardiovascular: Normal rate GI:  nondistended skin: No rash, skin intact Musculoskeletal: No C-spine tenderness.  Positive nontender spasm right trapezius.  No tenderness over the anterior neck, trapezius, posterior neck muscles.  No meningismus. Pain with head rotation, flexion extension..  Sensation upper extremities normal, patient able to move right upper extremity through full AROM without any problem.  Grip strength 5/5 and equal bilaterally.  RP 2+ and equal bilaterally. Neck: No cervical lymphadenopathy. Neurologic: Alert & oriented x 3, no focal neuro deficits Psychiatric: Speech and behavior appropriate   ED Course   Medications  ketorolac (TORADOL) injection 30 mg (has no administration in time range)    No orders of the defined types were placed in this encounter.   No results found for this or any previous visit (from the past 24 hour(s)). No results found.  ED Clinical Impression  1. Muscle spasms of neck      ED Assessment/Plan  No evidence of deep space infection.  No fevers, no sore  throat, dental pain.  Suspect that patient slept on her neck wrong and has a deep muscle spasm.  Will treat as musculoskeletal with Toradol 30 mg IM here, she already took Aminofen earlier.  Home with Zanaflex, ibuprofen 600 mg combined with 1 g of Tylenol 3-4 times a day as needed for pain.  Heat.  Discussed  MDM, treatment plan, and plan for follow-up with patient. Discussed sn/sx that should prompt return to the ED. patient agrees with plan.   Meds ordered this encounter  Medications  . ketorolac (TORADOL) injection 30 mg  . tiZANidine (ZANAFLEX) 4 MG tablet    Sig: Take 1 tablet (4 mg total) by mouth every 8 (eight) hours as needed for muscle spasms.    Dispense:  30 tablet    Refill:  0  . ibuprofen (ADVIL) 600 MG tablet    Sig: Take 1 tablet (600 mg total) by mouth every 6 (six) hours as needed.    Dispense:  30 tablet    Refill:  0    *This clinic note was created using [employer]. Therefore, there may be occasional mistakes despite careful proofreading.   ?    Melynda Ripple, MD 09/07/19 1257

## 2019-09-07 NOTE — Discharge Instructions (Addendum)
Take 600 mg of ibuprofen combined with 1 g of Tylenol 3-4 times a day as needed for pain.  Zanaflex will help with the muscle spasms.  I have given you Toradol here, and you have already taken some Tylenol this morning.  You may take Tylenol/ibuprofen tonight around 6-8 PM.  Zanaflex you can take immediately as soon as you get it..  Follow-up with your doctor as needed.  Go immediately to the emergency department for fevers above 100.4, severe headache, numbness tingling or weakness in your arm, if your pain is not controlled with Tylenol ibuprofen Zanaflex or for any other concerns.

## 2020-01-07 ENCOUNTER — Other Ambulatory Visit: Payer: Self-pay

## 2020-01-07 ENCOUNTER — Ambulatory Visit (INDEPENDENT_AMBULATORY_CARE_PROVIDER_SITE_OTHER): Payer: 59

## 2020-01-07 ENCOUNTER — Ambulatory Visit
Admission: EM | Admit: 2020-01-07 | Discharge: 2020-01-07 | Disposition: A | Discharge status: Home / Self Care | Payer: 59 | Attending: Family Medicine | Admitting: Family Medicine

## 2020-01-07 DIAGNOSIS — M542 Cervicalgia: Secondary | ICD-10-CM

## 2020-01-07 DIAGNOSIS — S161XXA Strain of muscle, fascia and tendon at neck level, initial encounter: Secondary | ICD-10-CM | POA: Diagnosis not present

## 2020-01-07 MED ORDER — CYCLOBENZAPRINE HCL 10 MG PO TABS: 10.0000 mg | ORAL_TABLET | Freq: Every day | ORAL | 0 refills | Status: DC

## 2020-01-07 MED ORDER — MELOXICAM 15 MG PO TABS: 15.0000 mg | ORAL_TABLET | Freq: Every day | ORAL | 0 refills | Status: DC

## 2020-01-07 NOTE — ED Triage Notes (Signed)
Pt presents with c/o head and neck pain for the past 2 months. Pt reports frequent headaches. Pt states she has a sharp pain radiating from her neck into her head. Pt states she feels she has a hard time even holding her head up.

## 2020-01-07 NOTE — Discharge Instructions (Signed)
Heat, stretches and posture modification Tylenol as needed

## 2020-01-20 NOTE — ED Provider Notes (Signed)
[facility]    CSN: [id number] Arrival date & time: 01/07/20  0841      History   Chief Complaint Chief Complaint  Patient presents with  . Neck Pain  . Headache    HPI Gabrielle Brooks is a 24 y.o. female.   24 yo female with a c/o neck pain for the past 2 months. Pain radiates from the neck to the back of the head. Denies any injuries, falls, numbness/tingling, fevers, chills, rash.    Neck Pain Associated symptoms: headaches   Headache Associated symptoms: neck pain     Past Medical History:  Diagnosis Date  . Constipation   . Hemorrhoid     Patient Active Problem List   Diagnosis Date Noted  . Indication for care in labor or delivery 01/17/2018  . Pregnancy 01/17/2018  . 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 Chronic pain of right knee 05/13/2016  . Tobacco use 01/11/2016  . 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 Allergic rhinitis 05/12/2011    Past Surgical History:  Procedure Laterality Date  . Hemorroid Removal    . HERNIA REPAIR    . NOSE SURGERY      OB History    Gravida  1   Para  1   Term  0   Preterm  1   AB      Living  1     SAB      TAB      Ectopic      Multiple  0   Live Births  1            Home Medications    Prior to Admission medications   Medication Sig Start Date End Date Taking? Authorizing Provider  cyclobenzaprine (FLEXERIL) 10 MG tablet Take 1 tablet (10 mg total) by mouth at bedtime. 01/07/20   Norval Gable, MD  ibuprofen (ADVIL) 600 MG tablet Take 1 tablet (600 mg total) by mouth every 6 (six) hours as needed. 09/07/19   Melynda Ripple, MD  meloxicam (MOBIC) 15 MG tablet Take 1 tablet (15 mg total) by mouth daily. 01/07/20   Norval Gable, MD  tiZANidine (ZANAFLEX) 4 MG tablet Take 1 tablet (4 mg total) by mouth every 8 (eight) hours as needed for muscle spasms. 09/07/19   Melynda Ripple, MD    Family History Family History  Problem  Relation Age of Onset  . Healthy Mother   . Healthy Father   . ADD / ADHD Neg Hx   . Alcohol abuse Neg Hx   . Anxiety disorder Neg Hx   . Arthritis Neg Hx   . Asthma Neg Hx   . Birth defects Neg Hx   . Cancer Neg Hx   . COPD Neg Hx   . Depression Neg Hx   . Diabetes Neg Hx   . Drug abuse Neg Hx   . Early death Neg Hx   . Hearing loss Neg Hx   . Heart disease Neg Hx   . Kidney disease Neg Hx   . Varicose Veins Neg Hx   . Vision loss Neg Hx   . Stroke Neg Hx   . Obesity Neg Hx   . Miscarriages / Stillbirths Neg Hx   . Learning disabilities Neg Hx   . Intellectual disability Neg Hx   . Hypertension Neg Hx   . Hyperlipidemia Neg Hx     Social History Social History   Tobacco  Use  . Smoking status: Former Smoker    Types: Cigarettes  . Smokeless tobacco: Never Used  Substance Use Topics  . Alcohol use: Yes    Comment: occ  . Drug use: Yes    Types: Marijuana     Allergies   Patient has no known allergies.   Review of Systems Review of Systems  Musculoskeletal: Positive for neck pain.  Neurological: Positive for headaches.     Physical Exam Triage Vital Signs ED Triage Vitals  Enc Vitals Group     BP 01/07/20 0857 (!) 114/98     Pulse Rate 01/07/20 0857 72     Resp 01/07/20 0857 (!) 55     Temp 01/07/20 0857 98.3 F (36.8 C)     Temp Source 01/07/20 0857 Oral     SpO2 01/07/20 0857 100 %     Weight 01/07/20 0856 160 lb (72.6 kg)     Height 01/07/20 0856 5\' 11"  (1.803 m)     Head Circumference --      Peak Flow --      Pain Score 01/07/20 0856 8     Pain Loc --      Pain Edu? --      Excl. in [place]? --    No data found.  Updated Vital Signs BP (!) 114/98 (BP Location: Left Arm)   Pulse 72   Temp 98.3 F (36.8 C) (Oral)   Resp (!) 55   Ht 5\' 11"  (1.803 m)   Wt 72.6 kg   LMP 01/06/2020   SpO2 100%   BMI 22.32 kg/m   Visual Acuity Right Eye Distance:   Left Eye Distance:   Bilateral Distance:    Right Eye Near:   Left Eye Near:      Bilateral Near:     Physical Exam Vitals and nursing note reviewed.  Constitutional:      General: She is not in acute distress.    Appearance: She is not toxic-appearing or diaphoretic.  Eyes:     Extraocular Movements: Extraocular movements intact.     Pupils: Pupils are equal, round, and reactive to light.  Musculoskeletal:     Cervical back: Neck supple. Spasms, tenderness and bony tenderness present. No swelling, edema, deformity, erythema, signs of trauma, lacerations, rigidity, torticollis or crepitus. No pain with movement. Normal range of motion.  Neurological:     Mental Status: She is alert.      UC Treatments / Results  Labs (all labs ordered are listed, but only abnormal results are displayed) Labs Reviewed - No data to display  EKG   Radiology No results found.  Procedures Procedures (including critical care time)  Medications Ordered in UC Medications - No data to display  Initial Impression / Assessment and Plan / UC Course  I have reviewed the triage vital signs and the nursing notes.  Pertinent labs & imaging results that were available during my care of the patient were reviewed by me and considered in my medical decision making (see chart for details).      Final Clinical Impressions(s) / UC Diagnoses   Final diagnoses:  Neck pain  Strain of neck muscle, initial encounter     Discharge Instructions     Heat, stretches and posture modification Tylenol as needed    ED Prescriptions    Medication Sig Dispense Auth. Provider   cyclobenzaprine (FLEXERIL) 10 MG tablet Take 1 tablet (10 mg total) by mouth at bedtime. 30 tablet [place],  MD   meloxicam (MOBIC) 15 MG tablet Take 1 tablet (15 mg total) by mouth daily. 30 tablet Norval Gable, MD     1. x-ray results and diagnosis reviewed with patient 2. rx as per orders above; reviewed possible side effects, interactions, risks and benefits  3. Recommend supportive treatment with  heat, stretches  4. Follow-up prn if symptoms worsen or don't improve   PDMP not reviewed this encounter.   Norval Gable, MD 01/20/20 2100

## 2020-02-04 ENCOUNTER — Ambulatory Visit
Admission: EM | Admit: 2020-02-04 | Discharge: 2020-02-04 | Disposition: A | Discharge status: Home / Self Care | Payer: 59 | Attending: Urgent Care | Admitting: Urgent Care

## 2020-02-04 ENCOUNTER — Encounter: Payer: Self-pay | Admitting: Emergency Medicine

## 2020-02-04 ENCOUNTER — Other Ambulatory Visit: Payer: Self-pay

## 2020-02-04 DIAGNOSIS — R5383 Other fatigue: Secondary | ICD-10-CM

## 2020-02-04 DIAGNOSIS — N946 Dysmenorrhea, unspecified: Secondary | ICD-10-CM

## 2020-02-04 DIAGNOSIS — R519 Headache, unspecified: Secondary | ICD-10-CM

## 2020-02-04 MED ORDER — MEFENAMIC ACID 250 MG PO CAPS: ORAL_CAPSULE | ORAL | 0 refills | Status: DC

## 2020-02-04 NOTE — Discharge Instructions (Addendum)
It was very nice seeing you today in clinic. Thank you for entrusting me with your care.   Increase fluid intake. May use Tylenol as needed for headache. Use new medication as prescribed. Would recommend that you discuss contraceptives as means to control you cycles (implants, IUD, etc.).   Make arrangements to follow up with your regular doctor in 1 week for re-evaluation if not improving. If your symptoms/condition worsens, please seek follow up care either here or in the ER. Please remember, our [place] providers are "right here with you" when you need [place].   Again, it was my pleasure to take care of you today. Thank you for choosing our clinic. I hope that you start to feel better quickly.   Honor Loh, MSN, APRN, FNP-C, CEN Advanced Practice Provider [facility]

## 2020-02-04 NOTE — ED Triage Notes (Addendum)
Patient c/o fatigue, headache and menstrual cramping that started over the weekend. She states she missed work yesterday and today. She says since she had her baby 2 years ago her menstrual cycle symptoms get worse. She states the symptoms will improve when her cycle ends.

## 2020-02-05 NOTE — ED Provider Notes (Signed)
[place]   Name: Gabrielle Brooks DOB: 10/20/1995 MRN: [id number] CSN: [id number] PCP: Janeece Agee, PA  Arrival date and time:  02/04/20 0846  Chief Complaint:  Menstrual Problem, Fatigue, and Headache  NOTE: Prior to seeing the patient today, I have reviewed the triage nursing documentation and vital signs. Clinical staff has updated patient's PMH/PSHx, current medication list, and drug allergies/intolerances to ensure comprehensive history available to assist in medical decision making.   History:   HPI: Gabrielle Brooks is a 24 y.o. female who presents today with complaints of fatigue, headache, and menstrual cramping that began over the weekend.  Patient reporting that the symptoms are associated with her normal menstrual cycle.  Symptoms have been progressive since the birth of her child 2 years ago.  Patient denies a history of migraine headaches.  She describes her current headache as "pressure around the temples".  She is not currently on any type of contraceptives to help manage the symptoms associated with her menstrual cycle.  She reports that she has been on oral BCPs, topical patches, and Depo-Provera injections in the past, however none of these interventions have been effective in managing her symptoms.  Patient reports that she missed work yesterday and today and requires a note documenting her visit today.  Past Medical History:  Diagnosis Date  . Constipation   . Hemorrhoid     Past Surgical History:  Procedure Laterality Date  . Hemorroid Removal    . HERNIA REPAIR    . NOSE SURGERY      Family History  Problem Relation Age of Onset  . Healthy Mother   . Healthy Father   . ADD / ADHD Neg Hx   . Alcohol abuse Neg Hx   . Anxiety disorder Neg Hx   . Arthritis Neg Hx   . Asthma Neg Hx   . Birth defects Neg Hx   . Cancer Neg Hx   . COPD Neg Hx   . Depression Neg Hx   . Diabetes Neg Hx   . Drug abuse Neg Hx   . Early death Neg Hx   . Hearing loss  Neg Hx   . Heart disease Neg Hx   . Kidney disease Neg Hx   . Varicose Veins Neg Hx   . Vision loss Neg Hx   . Stroke Neg Hx   . Obesity Neg Hx   . Miscarriages / Stillbirths Neg Hx   . Learning disabilities Neg Hx   . Intellectual disability Neg Hx   . Hypertension Neg Hx   . Hyperlipidemia Neg Hx     Social History   Tobacco Use  . Smoking status: Former Smoker    Types: Cigarettes  . Smokeless tobacco: Never Used  Substance Use Topics  . Alcohol use: Yes    Comment: occ  . Drug use: Yes    Types: Marijuana    Comment: yesterday 02/03/20    Patient Active Problem List   Diagnosis Date Noted  . Indication for care in labor or delivery 01/17/2018  . Pregnancy 01/17/2018  . 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 Chronic pain of right knee 05/13/2016  . Tobacco use 01/11/2016  . 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 Allergic rhinitis 05/12/2011    Home Medications:    No outpatient medications have been marked as taking for the 02/04/20 encounter [facility] Encounter).    Allergies:   Patient has no known allergies.  Review of Systems (ROS):  Review of systems NEGATIVE unless otherwise noted in narrative H&P section.   Vital Signs: Today's Vitals   02/04/20 0909 02/04/20 0910 02/04/20 0912 02/04/20 0934  BP:   110/79   Pulse:   70   Resp:   18   Temp:   98.8 F (37.1 C)   TempSrc:   Oral   SpO2:   98%   Weight:  160 lb (72.6 kg)    Height:  5\' 11"  (1.803 m)    PainSc: 7    7     Physical Exam: Physical Exam  Constitutional: She is oriented to person, place, and time and well-developed, well-nourished, and in no distress.  HENT:  Head: Normocephalic and atraumatic.  Eyes: Pupils are equal, round, and reactive to light.  Cardiovascular: Normal rate, regular rhythm, normal heart sounds and intact distal pulses.  Pulmonary/Chest: Effort normal and breath sounds normal.  Abdominal: Soft. Normal  appearance. There is no abdominal tenderness.  Genitourinary:    Genitourinary Comments: Exam deferred.  Currently on menstrual cycle. Patient is not currently pregnant.    Neurological: She is alert and oriented to person, place, and time. She has normal sensation, normal strength and normal reflexes. Gait normal.  Skin: Skin is warm and dry. No rash noted. She is not diaphoretic.  Psychiatric: Mood, memory, affect and judgment normal.  Nursing note and vitals reviewed.   Urgent Care Treatments / Results:   No orders of the defined types were placed in this encounter.   LABS: PLEASE NOTE: all labs that were ordered this encounter are listed, however only abnormal results are displayed. Labs Reviewed - No data to display  EKG: -None  RADIOLOGY: No results found.  PROCEDURES: Procedures  MEDICATIONS RECEIVED THIS VISIT: Medications - No data to display  PERTINENT CLINICAL COURSE NOTES/UPDATES:   Initial Impression / Assessment and Plan / Urgent Care Course:  Pertinent labs & imaging results that were available during my care of the patient were personally reviewed by me and considered in my medical decision making (see lab/imaging section of note for values and interpretations).  Gabrielle Brooks is a 24 y.o. female who presents to [facility] today with complaints of Menstrual Problem, Fatigue, and Headache  Patient is well appearing overall in clinic today. She does not appear to be in any acute distress. Presenting symptoms (see HPI) and exam as documented above.  Symptoms associated with normal menstrual cycle.  Patient advises that her symptoms have been worse since the birth of her child.  She has tried many contraceptive interventions without effect.  Cramping is significant.  Treating with mefenamic acid.  Patient encouraged to follow-up with her PCP and or OB/GYN to discuss possible IUD or contraceptive implant.  May add APAP as needed for headache.  Current  clinical condition warrants patient being out of work in order to recover from her current injury/illness. She was provided with the appropriate documentation to provide to her place of employment that will allow for her to RTW on 02/05/2020 with no restrictions.   Discussed follow up with primary care physician in 1 week for re-evaluation. I have reviewed the follow up and strict return precautions for any new or worsening symptoms. Patient is aware of symptoms that would be deemed urgent/emergent, and would thus require further evaluation either here or in the emergency department. At the time of discharge, she verbalized understanding and consent with the discharge plan as it was reviewed with her.  All questions were fielded by provider and/or clinic staff prior to patient discharge.    Final Clinical Impressions / Urgent Care Diagnoses:   Final diagnoses:  Menstrual cramps  Acute nonintractable headache, unspecified headache type  Fatigue, unspecified type    New Prescriptions:  [place] Controlled Substance Registry consulted? Not Applicable  Meds ordered this encounter  Medications  . Mefenamic Acid 250 MG CAPS    Sig: Take 2 caps (500 mg), then 1 capsule (250 mg) every 8 hours as needed for menstrual cramping.    Dispense:  30 capsule    Refill:  0    Recommended Follow up Care:  Patient encouraged to follow up with the following provider within the specified time frame, or sooner as dictated by the severity of her symptoms. As always, she was instructed that for any urgent/emergent care needs, she should seek care either here or in the emergency department for more immediate evaluation.  Follow-up Information    [facility], Gordan Payment, PA In 1 week.   Specialty: Family Medicine Why: General reassessment of symptoms if not improving Contact information: [address] [place]  [address] [phone]         NOTE: This note was prepared using Dragon  dictation software along with smaller phrase technology. Despite my best ability to proofread, there is the potential that transcriptional errors may still occur from this process, and are completely unintentional.    Karen Kitchens, NP 02/05/20 [phone]

## 2020-05-11 ENCOUNTER — Ambulatory Visit: Admit: 2020-05-11 | Discharge status: Absent without leave | Payer: 59

## 2020-05-12 ENCOUNTER — Other Ambulatory Visit: Payer: Self-pay

## 2020-05-12 ENCOUNTER — Ambulatory Visit
Admission: RE | Admit: 2020-05-12 | Discharge: 2020-05-12 | Disposition: A | Discharge status: Home / Self Care | Payer: 59 | Source: Ambulatory Visit | Attending: Emergency Medicine | Admitting: Emergency Medicine

## 2020-05-12 VITALS — BP 111/83 | HR 71 | Temp 98.5°F | Resp 18 | Ht 71.0 in | Wt 165.0 lb

## 2020-05-12 DIAGNOSIS — Z113 Encounter for screening for infections with a predominantly sexual mode of transmission: Secondary | ICD-10-CM | POA: Diagnosis not present

## 2020-05-12 DIAGNOSIS — B9689 Other specified bacterial agents as the cause of diseases classified elsewhere: Secondary | ICD-10-CM

## 2020-05-12 DIAGNOSIS — N760 Acute vaginitis: Secondary | ICD-10-CM

## 2020-05-12 DIAGNOSIS — N898 Other specified noninflammatory disorders of vagina: Secondary | ICD-10-CM

## 2020-05-12 LAB — WET PREP, GENITAL
Sperm: NONE SEEN
Trich, Wet Prep: NONE SEEN
Yeast Wet Prep HPF POC: NONE SEEN

## 2020-05-12 MED ORDER — METRONIDAZOLE 500 MG PO TABS
500.0000 mg | ORAL_TABLET | Freq: Two times a day (BID) | ORAL | 0 refills | Status: AC
Start: 2020-05-12 — End: 2020-05-19

## 2020-05-12 MED ORDER — FLUCONAZOLE 150 MG PO TABS: 150.0000 mg | ORAL_TABLET | Freq: Once | ORAL | 1 refills | Status: AC

## 2020-05-12 NOTE — ED Triage Notes (Signed)
Patient in today c/o vaginal discharge and odor x 1 week. Patient has a history of BV. Patient is also requesting GC/Chlamydia tests.

## 2020-05-12 NOTE — ED Provider Notes (Signed)
HPI  SUBJECTIVE:  Gabrielle Brooks is a 24 y.o. female who presents with an odorous vaginal discharge for the past week.  No vaginal itching, genital rash, labial swelling.  No fevers, abdominal, back, pelvic pain.  No urinary complaints.  She is in a long-term monogamous relationship with a female who is asymptomatic, however she would like to check for STDs today.  No recent antibiotics, perfumed soaps or body washes.  No aggravating or alleviating factors.  She has not tried anything for this.  She has a past medical history of gonorrhea, chlamydia, BV and yeast.  No history of HSV, HIV, syphilis, trichomonas, diabetes.  LMP: 2 weeks ago.  Denies the possibility being pregnant.  [id number], Gordan Payment, PA  Past Medical History:  Diagnosis Date  . Constipation   . Hemorrhoid     Past Surgical History:  Procedure Laterality Date  . Hemorroid Removal    . HERNIA REPAIR    . NOSE SURGERY      Family History  Problem Relation Age of Onset  . Fibroids Mother   . Healthy Father   . ADD / ADHD Neg Hx   . Alcohol abuse Neg Hx   . Anxiety disorder Neg Hx   . Arthritis Neg Hx   . Asthma Neg Hx   . Birth defects Neg Hx   . Cancer Neg Hx   . COPD Neg Hx   . Depression Neg Hx   . Diabetes Neg Hx   . Drug abuse Neg Hx   . Early death Neg Hx   . Hearing loss Neg Hx   . Heart disease Neg Hx   . Kidney disease Neg Hx   . Varicose Veins Neg Hx   . Vision loss Neg Hx   . Stroke Neg Hx   . Obesity Neg Hx   . Miscarriages / Stillbirths Neg Hx   . Learning disabilities Neg Hx   . Intellectual disability Neg Hx   . Hypertension Neg Hx   . Hyperlipidemia Neg Hx     Social History   Tobacco Use  . Smoking status: Current Every Day Smoker    Types: Cigarettes  . Smokeless tobacco: Never Used  . Tobacco comment: 2 cig per day  Vaping Use  . Vaping Use: Never used  Substance Use Topics  . Alcohol use: Yes    Comment: occ  . Drug use: Yes    Frequency: 4.0 times per week     Types: Marijuana    No current facility-administered medications for this encounter.  Current Outpatient Medications:  .  metroNIDAZOLE (FLAGYL) 500 MG tablet, Take 1 tablet (500 mg total) by mouth 2 (two) times daily for 7 days., Disp: 14 tablet, Rfl: 0  No Known Allergies   ROS  As noted in HPI.   Physical Exam  BP 111/83 (BP Location: Left Arm)   Pulse 71   Temp 98.5 F (36.9 C) (Oral)   Resp 18   Ht 5\' 11"  (1.803 m)   Wt 74.8 kg   LMP 04/28/2020 (Approximate)   SpO2 99%   BMI 23.01 kg/m   Constitutional: Well developed, well nourished, no acute distress Eyes:  EOMI, conjunctiva normal bilaterally HENT: Normocephalic, atraumatic,mucus membranes moist Respiratory: Normal inspiratory effort Cardiovascular: Normal rate GI: nondistended soft, nontender. No suprapubic tenderness  back: No CVA tenderness GU: deferred skin: No rash, skin intact Musculoskeletal: no deformities Neurologic: Alert & oriented x 3, no focal neuro deficits Psychiatric: Speech and behavior appropriate  ED Course   Medications - No data to display  Orders Placed This Encounter  Procedures  . Wet prep, genital    Standing Status:   Standing    Number of Occurrences:   1  . [place] rt PCR ([place] only)    Standing Status:   Standing    Number of Occurrences:   1    Results for orders placed or performed during the hospital encounter of 05/12/20 (from the past 24 hour(s))  Wet prep, genital     Status: Abnormal   Collection Time: 05/12/20  5:47 PM   Specimen: Vaginal  Result Value Ref Range   Yeast Wet Prep HPF POC NONE SEEN NONE SEEN   Trich, Wet Prep NONE SEEN NONE SEEN   Clue Cells Wet Prep HPF POC PRESENT (A) NONE SEEN   WBC, Wet Prep HPF POC PRESENT (A) NONE SEEN   Sperm NONE SEEN   Chlamydia/NGC rt PCR ([facility] only)     Status: None   Collection Time: 05/12/20  5:47 PM   Specimen: Vaginal  Result Value Ref Range   Specimen source GC/Chlam ENDOCERVICAL    Chlamydia Tr  NOT DETECTED NOT DETECTED   N gonorrhoeae NOT DETECTED NOT DETECTED   No results found.  ED Clinical Impression  1. BV (bacterial vaginosis)   2. Screening for STD (sexually transmitted disease)      ED Assessment/Plan  Wet prep positive for BV.  Gonorrhea chlamydia, trichomonas yeast negative.  Home with Flagyl for a week.  No intercourse until her symptoms have resolved and she knows her lab results.  Giving prescription Diflucan as she states she gets frequent yeast infections when she takes Flagyl   Meds ordered this encounter  Medications  . metroNIDAZOLE (FLAGYL) 500 MG tablet    Sig: Take 1 tablet (500 mg total) by mouth 2 (two) times daily for 7 days.    Dispense:  14 tablet    Refill:  0  . fluconazole (DIFLUCAN) 150 MG tablet    Sig: Take 1 tablet (150 mg total) by mouth once for 1 dose. 1 tab po x 1. May repeat in 72 hours if no improvement    Dispense:  2 tablet    Refill:  1    *This clinic note was created using [employer]. Therefore, there may be occasional mistakes despite careful proofreading.  ?    Melynda Ripple, MD 05/13/20 1558

## 2020-05-12 NOTE — Discharge Instructions (Addendum)
Take the medication as written. Give us a working phone number so that we can contact you if needed. Refrain from sexual contact until you know your results and your partner(s) are treated if necessary.   Go to www.goodrx.com to look up your medications. This will give you a list of where you can find your prescriptions at the most affordable prices. Or ask the pharmacist what the cash price is, or if they have any other discount programs available to help make your medication more affordable. This can be less expensive than what you would pay with insurance.   

## 2020-05-13 LAB — CHLAMYDIA/NGC RT PCR (ARMC ONLY)
Chlamydia Tr: NOT DETECTED
N gonorrhoeae: NOT DETECTED

## 2020-09-01 ENCOUNTER — Encounter: Payer: Self-pay | Admitting: Emergency Medicine

## 2020-09-01 ENCOUNTER — Other Ambulatory Visit: Payer: Self-pay

## 2020-09-01 ENCOUNTER — Ambulatory Visit
Admission: EM | Admit: 2020-09-01 | Discharge: 2020-09-01 | Disposition: A | Discharge status: Home / Self Care | Payer: 59 | Attending: Family Medicine | Admitting: Family Medicine

## 2020-09-01 ENCOUNTER — Ambulatory Visit: Payer: Self-pay

## 2020-09-01 DIAGNOSIS — N760 Acute vaginitis: Secondary | ICD-10-CM | POA: Diagnosis not present

## 2020-09-01 DIAGNOSIS — B9689 Other specified bacterial agents as the cause of diseases classified elsewhere: Secondary | ICD-10-CM | POA: Insufficient documentation

## 2020-09-01 LAB — WET PREP, GENITAL
Sperm: NONE SEEN
Trich, Wet Prep: NONE SEEN
Yeast Wet Prep HPF POC: NONE SEEN

## 2020-09-01 LAB — URINALYSIS, COMPLETE (UACMP) WITH MICROSCOPIC
Bilirubin Urine: NEGATIVE
Glucose, UA: NEGATIVE mg/dL
Hgb urine dipstick: NEGATIVE
Ketones, ur: 15 mg/dL — AB
Leukocytes,Ua: NEGATIVE
Nitrite: NEGATIVE
Protein, ur: NEGATIVE mg/dL
RBC / HPF: NONE SEEN RBC/hpf (ref 0–5)
Specific Gravity, Urine: 1.02 (ref 1.005–1.030)
pH: 7.5 (ref 5.0–8.0)

## 2020-09-01 MED ORDER — METRONIDAZOLE 500 MG PO TABS
500.0000 mg | ORAL_TABLET | Freq: Two times a day (BID) | ORAL | 0 refills | Status: DC
Start: 2020-09-01 — End: 2021-02-05

## 2020-09-01 MED ORDER — FLUCONAZOLE 200 MG PO TABS
200.0000 mg | ORAL_TABLET | Freq: Every day | ORAL | 0 refills | Status: AC
Start: 2020-09-01 — End: 2020-09-08

## 2020-09-01 NOTE — ED Triage Notes (Signed)
Patient c/o vaginal odor and discharge that started 1 week ago. Patient does has a history of BV.

## 2020-09-01 NOTE — ED Provider Notes (Signed)
[facility]    CSN: [id number] Arrival date & time: 09/01/20  1258      History   Chief Complaint Chief Complaint  Patient presents with  . Vaginal Discharge    HPI Gabrielle Brooks is a 24 y.o. female.   HPI   24 year old female here for evaluation of vaginal discharge with an odor x1 week.  Patient reports that she has been getting BV frequently after her menses.  Patient reports that her symptoms are consistent with her typical BV symptoms.  Patient denies painful urination, increased urinary urgency or frequency, vaginal pain or itching, fever, back pain, abdominal pain, nausea, vomiting, or diarrhea.  Past Medical History:  Diagnosis Date  . Constipation   . Hemorrhoid     Patient Active Problem List   Diagnosis Date Noted  . Indication for care in labor or delivery 01/17/2018  . Pregnancy 01/17/2018  . 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 Chronic pain of right knee 05/13/2016  . Tobacco use 01/11/2016  . 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 Allergic rhinitis 05/12/2011    Past Surgical History:  Procedure Laterality Date  . Hemorroid Removal    . HERNIA REPAIR    . NOSE SURGERY      OB History    Gravida  1   Para  1   Term  0   Preterm  1   AB      Living  1     SAB      TAB      Ectopic      Multiple  0   Live Births  1            Home Medications    Prior to Admission medications   Medication Sig Start Date End Date Taking? Authorizing Provider  metroNIDAZOLE (FLAGYL) 500 MG tablet Take 1 tablet (500 mg total) by mouth 2 (two) times daily. 09/01/20   Margarette Canada, NP    Family History Family History  Problem Relation Age of Onset  . Fibroids Mother   . Healthy Father   . ADD / ADHD Neg Hx   . Alcohol abuse Neg Hx   . Anxiety disorder Neg Hx   . Arthritis Neg Hx   . Asthma Neg Hx   . Birth defects Neg Hx   . Cancer Neg Hx   . COPD Neg Hx   .  Depression Neg Hx   . Diabetes Neg Hx   . Drug abuse Neg Hx   . Early death Neg Hx   . Hearing loss Neg Hx   . Heart disease Neg Hx   . Kidney disease Neg Hx   . Varicose Veins Neg Hx   . Vision loss Neg Hx   . Stroke Neg Hx   . Obesity Neg Hx   . Miscarriages / Stillbirths Neg Hx   . Learning disabilities Neg Hx   . Intellectual disability Neg Hx   . Hypertension Neg Hx   . Hyperlipidemia Neg Hx     Social History Social History   Tobacco Use  . Smoking status: Current Every Day Smoker    Types: Cigarettes  . Smokeless tobacco: Never Used  . Tobacco comment: 2 cig per day  Vaping Use  . Vaping Use: Never used  Substance Use Topics  . Alcohol use: Yes    Comment: occ  . Drug use: Yes    Frequency: 4.0 times  per week    Types: Marijuana     Allergies   Patient has no known allergies.   Review of Systems Review of Systems  Constitutional: Negative for activity change, appetite change and fever.  HENT: Positive for congestion. Negative for sinus pressure.   Cardiovascular: Negative for chest pain.  Gastrointestinal: Negative for abdominal pain, diarrhea, nausea and vomiting.  Genitourinary: Positive for vaginal discharge. Negative for dysuria, frequency, genital sores, hematuria, pelvic pain, urgency, vaginal bleeding and vaginal pain.  Musculoskeletal: Negative for back pain.  Skin: Negative for rash.  Neurological: Negative for headaches.  Hematological: Negative.   Psychiatric/Behavioral: Negative.      Physical Exam Triage Vital Signs ED Triage Vitals  Enc Vitals Group     BP 09/01/20 1424 119/75     Pulse Rate 09/01/20 1424 73     Resp 09/01/20 1424 18     Temp 09/01/20 1424 98.4 F (36.9 C)     Temp Source 09/01/20 1424 Oral     SpO2 09/01/20 1424 100 %     Weight 09/01/20 1422 170 lb (77.1 kg)     Height 09/01/20 1422 5\' 11"  (1.803 m)     Head Circumference --      Peak Flow --      Pain Score 09/01/20 1422 0     Pain Loc --      Pain Edu?  --      Excl. in [place]? --    No data found.  Updated Vital Signs BP 119/75 (BP Location: Left Arm)   Pulse 73   Temp 98.4 F (36.9 C) (Oral)   Resp 18   Ht 5\' 11"  (1.803 m)   Wt 170 lb (77.1 kg)   LMP 08/25/2020   SpO2 100%   BMI 23.71 kg/m   Visual Acuity Right Eye Distance:   Left Eye Distance:   Bilateral Distance:    Right Eye Near:   Left Eye Near:    Bilateral Near:     Physical Exam Vitals and nursing note reviewed.  Constitutional:      General: She is not in acute distress.    Appearance: Normal appearance. She is normal weight. She is not toxic-appearing.  HENT:     Head: Normocephalic.  Eyes:     General: No scleral icterus.    Extraocular Movements: Extraocular movements intact.     Conjunctiva/sclera: Conjunctivae normal.     Pupils: Pupils are equal, round, and reactive to light.  Cardiovascular:     Rate and Rhythm: Normal rate and regular rhythm.     Pulses: Normal pulses.     Heart sounds: Normal heart sounds. No murmur heard.  No gallop.   Pulmonary:     Effort: Pulmonary effort is normal.     Breath sounds: Normal breath sounds. No wheezing, rhonchi or rales.  Abdominal:     General: Abdomen is flat. Bowel sounds are normal.     Palpations: Abdomen is soft.     Tenderness: There is no abdominal tenderness. There is no right CVA tenderness, left CVA tenderness, guarding or rebound.  Musculoskeletal:        General: No swelling or tenderness. Normal range of motion.  Skin:    General: Skin is warm and dry.     Capillary Refill: Capillary refill takes less than 2 seconds.  Neurological:     General: No focal deficit present.     Mental Status: She is alert and oriented to person, place, and  time.  Psychiatric:        Mood and Affect: Mood normal.        Behavior: Behavior normal.        Thought Content: Thought content normal.        Judgment: Judgment normal.      UC Treatments / Results  Labs (all labs ordered are listed, but only  abnormal results are displayed) Labs Reviewed  WET PREP, GENITAL - Abnormal; Notable for the following components:      Result Value   Clue Cells Wet Prep HPF POC PRESENT (*)    WBC, Wet Prep HPF POC FEW (*)    All other components within normal limits  URINALYSIS, COMPLETE (UACMP) WITH MICROSCOPIC - Abnormal; Notable for the following components:   Color, Urine STRAW (*)    APPearance HAZY (*)    Ketones, ur 15 (*)    Bacteria, UA RARE (*)    All other components within normal limits    EKG   Radiology No results found.  Procedures Procedures (including critical care time)  Medications Ordered in UC Medications - No data to display  Initial Impression / Assessment and Plan / UC Course  I have reviewed the triage vital signs and the nursing notes.  Pertinent labs & imaging results that were available during my care of the patient were reviewed by me and considered in my medical decision making (see chart for details).   Patient is here for evaluation of a vaginal discharge with an odor that she says is typical of when she gets BV.  She reports that she has been frequently getting BV after her periods as of late.  Patient states that she does use tampons and we discussed possibly changing the brand.  Patient also advised that taking vaginal probiotics or using boric acid suppositories may be helpful in preventing further outbreaks of BV.  Will send wet prep.  UA collected by nursing staff.   Prep is positive for bacterial vaginosis.  Also shows rare bacteria but no leukocytes or nitrates.  We will treat for bacterial vaginosis with Flagyl twice daily time 7 days.   Final Clinical Impressions(s) / UC Diagnoses   Final diagnoses:  BV (bacterial vaginosis)     Discharge Instructions     Take the Flagyl twice daily for 7 days.  Do not consume alcohol while you are taking the Flagyl because this will cause you to vomit.  Consider changing your brand of tampon to see  if that has any bearing on the recurrence of BV following your periods.  Consider using vaginal probiotics or boric acid suppositories to help prevent recurrence of BV.  You can also discuss this with your GYN.    ED Prescriptions    Medication Sig Dispense Auth. Provider   metroNIDAZOLE (FLAGYL) 500 MG tablet Take 1 tablet (500 mg total) by mouth 2 (two) times daily. 14 tablet Margarette Canada, NP     PDMP not reviewed this encounter.   Margarette Canada, NP 09/01/20 1530

## 2020-09-01 NOTE — Discharge Instructions (Signed)
Take the Flagyl twice daily for 7 days.  Do not consume alcohol while you are taking the Flagyl because this will cause you to vomit.  Consider changing your brand of tampon to see if that has any bearing on the recurrence of BV following your periods.  Consider using vaginal probiotics or boric acid suppositories to help prevent recurrence of BV.  You can also discuss this with your GYN.

## 2020-12-14 ENCOUNTER — Ambulatory Visit: Admit: 2020-12-14 | Disposition: A | Discharge status: Home / Self Care | Payer: 59

## 2020-12-15 ENCOUNTER — Ambulatory Visit
Admission: RE | Admit: 2020-12-15 | Discharge: 2020-12-15 | Disposition: A | Discharge status: Home / Self Care | Payer: 59 | Source: Ambulatory Visit | Attending: Physician Assistant | Admitting: Physician Assistant

## 2020-12-15 ENCOUNTER — Other Ambulatory Visit: Payer: Self-pay

## 2020-12-15 VITALS — BP 114/89 | HR 78 | Temp 98.6°F | Resp 18 | Ht 71.0 in | Wt 170.0 lb

## 2020-12-15 DIAGNOSIS — H1031 Unspecified acute conjunctivitis, right eye: Secondary | ICD-10-CM

## 2020-12-15 MED ORDER — POLYMYXIN B-TRIMETHOPRIM 10000-0.1 UNIT/ML-% OP SOLN
1.0000 [drp] | OPHTHALMIC | 0 refills | Status: AC
Start: 2020-12-15 — End: 2020-12-22

## 2020-12-15 MED ORDER — NAPHAZOLINE-PHENIRAMINE 0.025-0.3 % OP SOLN: 2.0000 [drp] | Freq: Four times a day (QID) | OPHTHALMIC | 0 refills | Status: AC | PRN

## 2020-12-15 NOTE — ED Triage Notes (Signed)
Pt c/o right eye irritation, itchy and dryness. Started about a week ago. She states she has tried OTC pink eye drops without relief.

## 2020-12-15 NOTE — Discharge Instructions (Addendum)
Take daily Zyrtec.  Try the Naphcon-A drops.  If no improvement in 3 days or you start to have discolored drainage then fill the antibiotic eyedrop.

## 2020-12-15 NOTE — ED Provider Notes (Signed)
[facility]    CSN: [id number] Arrival date & time: 12/15/20  0920      History   Chief Complaint Chief Complaint  Patient presents with  . Eye Problem    HPI Gabrielle Brooks is a 25 y.o. female presenting for approximately 1 week history of redness and irritation of the right eye.  She says that it is itchy but not painful.  She has had some clear to faint yellow drainage from the eye.  No vision changes.  No swelling of the eyelids.  She does state that she gets allergies and does have some slight congestion.  Start over-the-counter "pinkeye eyedrops" without relief.  No fevers.  Denies any cough or sore throat or headaches.  No ear pain or dizziness.  No sick contacts.  Does not wear contacts or glasses.  No other complaints or concerns.  HPI  Past Medical History:  Diagnosis Date  . Constipation   . Hemorrhoid     Patient Active Problem List   Diagnosis Date Noted  . Indication for care in labor or delivery 01/17/2018  . Pregnancy 01/17/2018  . 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 Chronic pain of right knee 05/13/2016  . Tobacco use 01/11/2016  . 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 Allergic rhinitis 05/12/2011    Past Surgical History:  Procedure Laterality Date  . Hemorroid Removal    . HERNIA REPAIR    . NOSE SURGERY      OB History    Gravida  1   Para  1   Term  0   Preterm  1   AB      Living  1     SAB      IAB      Ectopic      Multiple  0   Live Births  1            Home Medications    Prior to Admission medications   Medication Sig Start Date End Date Taking? Authorizing Provider  naphazoline-pheniramine (NAPHCON-A) 0.025-0.3 % ophthalmic solution Place 2 drops into the right eye 4 (four) times daily as needed for up to 7 days for eye irritation. 12/15/20 12/22/20 Yes Laurene Footman B, PA-C  trimethoprim-polymyxin b (POLYTRIM) ophthalmic solution Place 1 drop into  the right eye every 4 (four) hours for 7 days. 12/15/20 12/22/20 Yes Danton Clap, PA-C  metroNIDAZOLE (FLAGYL) 500 MG tablet Take 1 tablet (500 mg total) by mouth 2 (two) times daily. 09/01/20   Margarette Canada, NP    Family History Family History  Problem Relation Age of Onset  . Fibroids Mother   . Healthy Father   . ADD / ADHD Neg Hx   . Alcohol abuse Neg Hx   . Anxiety disorder Neg Hx   . Arthritis Neg Hx   . Asthma Neg Hx   . Birth defects Neg Hx   . Cancer Neg Hx   . COPD Neg Hx   . Depression Neg Hx   . Diabetes Neg Hx   . Drug abuse Neg Hx   . Early death Neg Hx   . Hearing loss Neg Hx   . Heart disease Neg Hx   . Kidney disease Neg Hx   . Varicose Veins Neg Hx   . Vision loss Neg Hx   . Stroke Neg Hx   . Obesity Neg Hx   . Miscarriages / Stillbirths Neg  Hx   . Learning disabilities Neg Hx   . Intellectual disability Neg Hx   . Hypertension Neg Hx   . Hyperlipidemia Neg Hx     Social History Social History   Tobacco Use  . Smoking status: Current Every Day Smoker    Types: Cigarettes  . Smokeless tobacco: Never Used  . Tobacco comment: 2 cig per day  Vaping Use  . Vaping Use: Never used  Substance Use Topics  . Alcohol use: Yes    Comment: occ  . Drug use: Yes    Frequency: 4.0 times per week    Types: Marijuana     Allergies   Patient has no known allergies.   Review of Systems Review of Systems  Constitutional: Negative for chills, diaphoresis, fatigue and fever.  HENT: Negative for congestion, ear pain, rhinorrhea and sore throat.   Eyes: Positive for discharge, redness and itching. Negative for photophobia, pain and visual disturbance.  Respiratory: Negative for cough and shortness of breath.   Gastrointestinal: Negative for nausea and vomiting.  Skin: Negative for rash.  Neurological: Negative for dizziness and headaches.     Physical Exam Triage Vital Signs ED Triage Vitals  Enc Vitals Group     BP 12/15/20 0943 114/89     Pulse  Rate 12/15/20 0943 78     Resp 12/15/20 0943 18     Temp 12/15/20 0943 98.6 F (37 C)     Temp Source 12/15/20 0943 Oral     SpO2 12/15/20 0943 99 %     Weight 12/15/20 0941 169 lb 15.6 oz (77.1 kg)     Height 12/15/20 0941 5\' 11"  (1.803 m)     Head Circumference --      Peak Flow --      Pain Score 12/15/20 0941 0     Pain Loc --      Pain Edu? --      Excl. in [place]? --    No data found.  Updated Vital Signs BP 114/89 (BP Location: Left Arm)   Pulse 78   Temp 98.6 F (37 C) (Oral)   Resp 18   Ht 5\' 11"  (1.803 m)   Wt 169 lb 15.6 oz (77.1 kg)   LMP 12/09/2020 (Approximate)   SpO2 99%   BMI 23.71 kg/m   Visual Acuity Right Eye Distance: 20/25 uncorrected Left Eye Distance: 20/25 uncorrected Bilateral Distance: 20/20 uncorrected  Right Eye Near:   Left Eye Near:    Bilateral Near:     Physical Exam Vitals and nursing note reviewed.  Constitutional:      General: She is not in acute distress.    Appearance: Normal appearance. She is not ill-appearing or toxic-appearing.  HENT:     Head: Normocephalic and atraumatic.     Nose: Nose normal.     Mouth/Throat:     Mouth: Mucous membranes are moist.     Pharynx: Oropharynx is clear.  Eyes:     General: Lids are normal. Vision grossly intact. No scleral icterus.       Right eye: No discharge.        Left eye: No discharge.     Extraocular Movements: Extraocular movements intact.     Conjunctiva/sclera:     Right eye: Right conjunctiva is injected.     Pupils: Pupils are equal, round, and reactive to light.  Cardiovascular:     Rate and Rhythm: Normal rate and regular rhythm.     Heart sounds:  Normal heart sounds.  Pulmonary:     Effort: Pulmonary effort is normal. No respiratory distress.     Breath sounds: Normal breath sounds.  Musculoskeletal:     Cervical back: Neck supple.  Skin:    General: Skin is dry.  Neurological:     General: No focal deficit present.     Mental Status: She is alert. Mental status  is at baseline.     Motor: No weakness.     Gait: Gait normal.  Psychiatric:        Mood and Affect: Mood normal.        Behavior: Behavior normal.        Thought Content: Thought content normal.      UC Treatments / Results  Labs (all labs ordered are listed, but only abnormal results are displayed) Labs Reviewed - No data to display  EKG   Radiology No results found.  Procedures Procedures (including critical care time)  Medications Ordered in UC Medications - No data to display  Initial Impression / Assessment and Plan / UC Course  I have reviewed the triage vital signs and the nursing notes.  Pertinent labs & imaging results that were available during my care of the patient were reviewed by me and considered in my medical decision making (see chart for details).   25 year old female presenting for redness, irritation and itching of the right eye.  Patient does have history of allergies.  Exam reveals diffuse erythema and injection of the right conjunctiva.  No drainage noted.  Advised patient this is likely allergic conjunctivitis and I sent Naphcon-A.  Patient concerned about bacterial infection.  I did advise her to take Zyrtec and try the Naphcon-A and if no improvement in the next 3 to 4 days or if any worsening then she can fill the Polytrim eyedrops.  Advised to follow-up with, our department or eye specialist for any worsening symptoms.  Final Clinical Impressions(s) / UC Diagnoses   Final diagnoses:  Acute conjunctivitis of right eye, unspecified acute conjunctivitis type     Discharge Instructions     Take daily Zyrtec.  Try the Naphcon-A drops.  If no improvement in 3 days or you start to have discolored drainage then fill the antibiotic eyedrop.    ED Prescriptions    Medication Sig Dispense Auth. Provider   naphazoline-pheniramine (NAPHCON-A) 0.025-0.3 % ophthalmic solution Place 2 drops into the right eye 4 (four) times daily as needed for up to 7  days for eye irritation. 15 mL Laurene Footman B, PA-C   trimethoprim-polymyxin b (POLYTRIM) ophthalmic solution Place 1 drop into the right eye every 4 (four) hours for 7 days. 10 mL Danton Clap, PA-C     PDMP not reviewed this encounter.   Danton Clap, PA-C 12/15/20 1050

## 2021-02-05 ENCOUNTER — Ambulatory Visit
Admission: EM | Admit: 2021-02-05 | Discharge: 2021-02-05 | Disposition: A | Discharge status: Home / Self Care | Payer: Self-pay | Attending: Family Medicine | Admitting: Family Medicine

## 2021-02-05 ENCOUNTER — Ambulatory Visit: Payer: Self-pay

## 2021-02-05 DIAGNOSIS — R350 Frequency of micturition: Secondary | ICD-10-CM | POA: Insufficient documentation

## 2021-02-05 LAB — URINALYSIS, COMPLETE (UACMP) WITH MICROSCOPIC
Glucose, UA: NEGATIVE mg/dL
Ketones, ur: NEGATIVE mg/dL
Leukocytes,Ua: NEGATIVE
Nitrite: NEGATIVE
Protein, ur: NEGATIVE mg/dL
Specific Gravity, Urine: 1.025 (ref 1.005–1.030)
pH: 6 (ref 5.0–8.0)

## 2021-02-05 MED ORDER — FLUCONAZOLE 150 MG PO TABS: 150.0000 mg | ORAL_TABLET | Freq: Once | ORAL | 0 refills | Status: AC

## 2021-02-05 MED ORDER — NITROFURANTOIN MONOHYD MACRO 100 MG PO CAPS
100.0000 mg | ORAL_CAPSULE | Freq: Two times a day (BID) | ORAL | 0 refills | Status: DC
Start: 2021-02-05 — End: 2022-01-16

## 2021-02-05 NOTE — ED Provider Notes (Signed)
[facility]    CSN: [id number] Arrival date & time: 02/05/21  1427      History   Chief Complaint Chief Complaint  Patient presents with  . Dysuria   HPI  25 year old female presents with urinary symptoms.  Symptoms started yesterday.  She reports urinary pressure and urinary frequency.  She reports that she has had some lower abdominal discomfort.  No fever.  No flank pain.  No relieving factors.  She is concerned that she has UTI.  No other complaints.   Past Medical History:  Diagnosis Date  . Constipation   . Hemorrhoid     Patient Active Problem List   Diagnosis Date Noted  . Indication for care in labor or delivery 01/17/2018  . Pregnancy 01/17/2018  . 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 Chronic pain of right knee 05/13/2016  . Tobacco use 01/11/2016  . 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 Allergic rhinitis 05/12/2011    Past Surgical History:  Procedure Laterality Date  . Hemorroid Removal    . HERNIA REPAIR    . NOSE SURGERY      OB History    Gravida  1   Para  1   Term  0   Preterm  1   AB      Living  1     SAB      IAB      Ectopic      Multiple  0   Live Births  1            Home Medications    Prior to Admission medications   Medication Sig Start Date End Date Taking? Authorizing Provider  fluconazole (DIFLUCAN) 150 MG tablet Take 1 tablet (150 mg total) by mouth once for 1 dose. Repeat dose in 72 hours. 02/05/21 02/05/21 Yes Vanassa Penniman G, DO  nitrofurantoin, macrocrystal-monohydrate, (MACROBID) 100 MG capsule Take 1 capsule (100 mg total) by mouth 2 (two) times daily. 02/05/21  Yes Coral Spikes, DO    Family History Family History  Problem Relation Age of Onset  . Fibroids Mother   . Healthy Father   . ADD / ADHD Neg Hx   . Alcohol abuse Neg Hx   . Anxiety disorder Neg Hx   . Arthritis Neg Hx   . Asthma Neg Hx   . Birth defects Neg Hx   . Cancer Neg  Hx   . COPD Neg Hx   . Depression Neg Hx   . Diabetes Neg Hx   . Drug abuse Neg Hx   . Early death Neg Hx   . Hearing loss Neg Hx   . Heart disease Neg Hx   . Kidney disease Neg Hx   . Varicose Veins Neg Hx   . Vision loss Neg Hx   . Stroke Neg Hx   . Obesity Neg Hx   . Miscarriages / Stillbirths Neg Hx   . Learning disabilities Neg Hx   . Intellectual disability Neg Hx   . Hypertension Neg Hx   . Hyperlipidemia Neg Hx     Social History Social History   Tobacco Use  . Smoking status: Current Every Day Smoker    Types: Cigarettes  . Smokeless tobacco: Never Used  . Tobacco comment: 2 cig per day  Vaping Use  . Vaping Use: Never used  Substance Use Topics  . Alcohol use: Yes    Comment: occ  .  Drug use: Yes    Frequency: 4.0 times per week    Types: Marijuana     Allergies   Patient has no known allergies.   Review of Systems Review of Systems Per HPI  Physical Exam Triage Vital Signs ED Triage Vitals  Enc Vitals Group     BP 02/05/21 1438 107/87     Pulse Rate 02/05/21 1438 (!) 57     Resp 02/05/21 1438 18     Temp 02/05/21 1438 98.8 F (37.1 C)     Temp Source 02/05/21 1438 Oral     SpO2 02/05/21 1438 100 %     Weight 02/05/21 1437 150 lb (68 kg)     Height 02/05/21 1437 5\' 11"  (1.803 m)     Head Circumference --      Peak Flow --      Pain Score 02/05/21 1436 8     Pain Loc --      Pain Edu? --      Excl. in [place]? --    Updated Vital Signs BP 107/87 (BP Location: Left Arm)   Pulse (!) 57   Temp 98.8 F (37.1 C) (Oral)   Resp 18   Ht 5\' 11"  (1.803 m)   Wt 68 kg   LMP 01/22/2021 (Approximate)   SpO2 100%   BMI 20.92 kg/m   Visual Acuity Right Eye Distance:   Left Eye Distance:   Bilateral Distance:    Right Eye Near:   Left Eye Near:    Bilateral Near:     Physical Exam Vitals and nursing note reviewed.  Constitutional:      General: She is not in acute distress.    Appearance: Normal appearance. She is not ill-appearing.   HENT:     Head: Normocephalic and atraumatic.  Eyes:     General:        Right eye: No discharge.        Left eye: No discharge.     Conjunctiva/sclera: Conjunctivae normal.  Pulmonary:     Effort: Pulmonary effort is normal. No respiratory distress.  Abdominal:     General: There is no distension.     Palpations: Abdomen is soft.     Tenderness: There is no abdominal tenderness.  Neurological:     Mental Status: She is alert.  Psychiatric:        Mood and Affect: Mood normal.        Behavior: Behavior normal.    UC Treatments / Results  Labs (all labs ordered are listed, but only abnormal results are displayed) Labs Reviewed  URINALYSIS, COMPLETE (UACMP) WITH MICROSCOPIC - Abnormal; Notable for the following components:      Result Value   APPearance HAZY (*)    Hgb urine dipstick TRACE (*)    Bilirubin Urine SMALL (*)    Bacteria, UA MANY (*)    All other components within normal limits  URINE CULTURE    EKG   Radiology No results found.  Procedures Procedures (including critical care time)  Medications Ordered in UC Medications - No data to display  Initial Impression / Assessment and Plan / UC Course  I have reviewed the triage vital signs and the nursing notes.  Pertinent labs & imaging results that were available during my care of the patient were reviewed by me and considered in my medical decision making (see chart for details).    25 year old female presents with urinary symptoms.  Her urinalysis is inconclusive given  the fact that she gave a dirty sample.  Awaiting culture.  Placing empirically on Macrobid while awaiting culture.  Patient requested Diflucan for possible development of yeast vaginitis from antibiotic therapy.  Diflucan was sent.  Final Clinical Impressions(s) / UC Diagnoses   Final diagnoses:  Urinary frequency   Discharge Instructions   None    ED Prescriptions    Medication Sig Dispense Auth. Provider   nitrofurantoin,  macrocrystal-monohydrate, (MACROBID) 100 MG capsule Take 1 capsule (100 mg total) by mouth 2 (two) times daily. 10 capsule Dasha Kawabata G, DO   fluconazole (DIFLUCAN) 150 MG tablet Take 1 tablet (150 mg total) by mouth once for 1 dose. Repeat dose in 72 hours. 2 tablet Coral Spikes, DO     PDMP not reviewed this encounter.   Coral Spikes, [place] 02/05/21 1642

## 2021-02-05 NOTE — ED Triage Notes (Signed)
Pt c/o abdominal pain, urinary pressure and her urine being "hot". Pt states symptoms have been increasing since yesterday. Pt is concerned she may have "diluted" her urine by drinking too much water. Pt denies f/n/v/d or other symptoms.

## 2021-02-07 LAB — URINE CULTURE

## 2021-09-08 ENCOUNTER — Ambulatory Visit: Admit: 2021-09-08 | Payer: Self-pay

## 2021-11-06 ENCOUNTER — Emergency Department
Admission: EM | Admit: 2021-11-06 | Discharge: 2021-11-06 | Disposition: A | Discharge status: Home / Self Care | Payer: Self-pay | Attending: Emergency Medicine | Admitting: Emergency Medicine

## 2021-11-06 DIAGNOSIS — N390 Urinary tract infection, site not specified: Secondary | ICD-10-CM | POA: Insufficient documentation

## 2021-11-06 LAB — CBC WITH DIFFERENTIAL/PLATELET
Abs Immature Granulocytes: 0.02 10*3/uL (ref 0.00–0.07)
Basophils Absolute: 0 10*3/uL (ref 0.0–0.1)
Basophils Relative: 0 %
Eosinophils Absolute: 0.2 10*3/uL (ref 0.0–0.5)
Eosinophils Relative: 3 %
HCT: 38.2 % (ref 36.0–46.0)
Hemoglobin: 12 g/dL (ref 12.0–15.0)
Immature Granulocytes: 0 %
Lymphocytes Relative: 31 %
Lymphs Abs: 2 10*3/uL (ref 0.7–4.0)
MCH: 25.2 pg — ABNORMAL LOW (ref 26.0–34.0)
MCHC: 31.4 g/dL (ref 30.0–36.0)
MCV: 80.3 fL (ref 80.0–100.0)
Monocytes Absolute: 0.9 10*3/uL (ref 0.1–1.0)
Monocytes Relative: 13 %
Neutro Abs: 3.3 10*3/uL (ref 1.7–7.7)
Neutrophils Relative %: 53 %
Platelets: 235 10*3/uL (ref 150–400)
RBC: 4.76 MIL/uL (ref 3.87–5.11)
RDW: 15.6 % — ABNORMAL HIGH (ref 11.5–15.5)
WBC: 6.4 10*3/uL (ref 4.0–10.5)
nRBC: 0 % (ref 0.0–0.2)

## 2021-11-06 LAB — COMPREHENSIVE METABOLIC PANEL
ALT: 20 U/L (ref 0–44)
AST: 23 U/L (ref 15–41)
Albumin: 3.8 g/dL (ref 3.5–5.0)
Alkaline Phosphatase: 52 U/L (ref 38–126)
Anion gap: 5 (ref 5–15)
BUN: 16 mg/dL (ref 6–20)
CO2: 25 mmol/L (ref 22–32)
Calcium: 8.6 mg/dL — ABNORMAL LOW (ref 8.9–10.3)
Chloride: 106 mmol/L (ref 98–111)
Creatinine, Ser: 0.56 mg/dL (ref 0.44–1.00)
GFR, Estimated: >60 mL/min (ref >60)
Glucose, Bld: 97 mg/dL (ref 70–99)
Potassium: 3.7 mmol/L (ref 3.5–5.1)
Sodium: 136 mmol/L (ref 135–145)
Total Bilirubin: 0.3 mg/dL (ref 0.3–1.2)
Total Protein: 7.2 g/dL (ref 6.5–8.1)

## 2021-11-06 LAB — URINALYSIS, ROUTINE W REFLEX MICROSCOPIC
Bilirubin Urine: NEGATIVE
Glucose, UA: NEGATIVE mg/dL
Hgb urine dipstick: NEGATIVE
Ketones, ur: NEGATIVE mg/dL
Leukocytes,Ua: NEGATIVE
Nitrite: NEGATIVE
Protein, ur: NEGATIVE mg/dL
Specific Gravity, Urine: 1.01 (ref 1.005–1.030)
pH: 7 (ref 5.0–8.0)

## 2021-11-06 LAB — POC URINE PREG, ED: Preg Test, Ur: NEGATIVE

## 2021-11-06 MED ORDER — NAPROXEN 500 MG PO TABS: 500.0000 mg | ORAL_TABLET | Freq: Two times a day (BID) | ORAL | 2 refills | Status: DC

## 2021-11-06 MED ORDER — CEPHALEXIN 500 MG PO CAPS: 500.0000 mg | ORAL_CAPSULE | Freq: Two times a day (BID) | ORAL | 0 refills | Status: DC

## 2021-11-06 MED ORDER — FLUCONAZOLE 150 MG PO TABS: 150.0000 mg | ORAL_TABLET | Freq: Once | ORAL | 0 refills | Status: AC

## 2021-11-06 NOTE — ED Notes (Signed)
Pt declined DC vitals at this time d/t [facility] being almost here to pick pt up. Pt Aox4, walked independently out of facility

## 2021-11-06 NOTE — ED Provider Notes (Signed)
[facility] Provider Note    Event Date/Time   First MD Initiated Contact with Patient 11/06/21 [phone]     (approximate)   History   Abdominal Pain   HPI  Gabrielle Brooks is a 26 y.o. female who presents with complaints of abdominal pain that started overnight.  She reports it is improving now.  She describes it as a burning sensation in her lower abdomen.  She states it feels similar to urinary tract infections that she had the past.  Denies fevers or chills.  No back pain.  No flank pain, positive urinary frequency     Physical Exam   Triage Vital Signs: ED Triage Vitals  Enc Vitals Group     BP 11/06/21 0425 122/80     Pulse Rate 11/06/21 0425 79     Resp 11/06/21 0425 20     Temp 11/06/21 0425 98.1 F (36.7 C)     Temp Source 11/06/21 0425 Oral     SpO2 11/06/21 0425 97 %     Weight 11/06/21 0429 79.4 kg (175 lb)     Height 11/06/21 0429 1.803 m (5\' 11" )     Head Circumference --      Peak Flow --      Pain Score 11/06/21 0430 10     Pain Loc --      Pain Edu? --      Excl. in [place]? --     Most recent vital signs: Vitals:   11/06/21 0425  BP: 122/80  Pulse: 79  Resp: 20  Temp: 98.1 F (36.7 C)  SpO2: 97%     General: Awake, no distress.  CV:  Good peripheral perfusion.  Resp:  Normal effort.  Abd:  No distention. No cva ttp, reassuring exam Other:     ED Results / Procedures / Treatments   Labs (all labs ordered are listed, but only abnormal results are displayed) Labs Reviewed  URINALYSIS, ROUTINE W REFLEX MICROSCOPIC - Abnormal; Notable for the following components:      Result Value   Color, Urine YELLOW (*)    APPearance CLEAR (*)    Bacteria, UA RARE (*)    All other components within normal limits  CBC WITH DIFFERENTIAL/PLATELET - Abnormal; Notable for the following components:   MCH 25.2 (*)    RDW 15.6 (*)    All other components within normal limits  COMPREHENSIVE METABOLIC PANEL - Abnormal; Notable for the  following components:   Calcium 8.6 (*)    All other components within normal limits  POC URINE PREG, ED     EKG     RADIOLOGY     PROCEDURES:  Critical Care performed:   Procedures   MEDICATIONS ORDERED IN ED: Medications - No data to display   IMPRESSION / MDM / [place] / ED COURSE  I reviewed the triage vital signs and the nursing notes.  Patient presents with lower abdominal pain as described above, burning sensation, now improved significantly.  She feels this may be related to urinary tract infection which is certainly a possibility.  Abdominal exam is benign.  Lab work reviewed, normal CBC, normal CMP, normal lipase.  Urinalysis with small amount of bacteria, no nitrites.  Patient reports that she drank a lot of water before coming and she thinks she may have "flush out her bladder ".  Will prescribe Keflex, discussed with her to only take it if urinary symptoms continue  No negation for  further imaging at this time, appropriate for outpatient follow-up, return precautions discussed          FINAL CLINICAL IMPRESSION(S) / ED DIAGNOSES   Final diagnoses:  Lower urinary tract infectious disease     Rx / DC Orders   ED Discharge Orders          Ordered    cephALEXin (KEFLEX) 500 MG capsule  2 times daily        11/06/21 0717    naproxen (NAPROSYN) 500 MG tablet  2 times daily with meals        11/06/21 0723    fluconazole (DIFLUCAN) 150 MG tablet   Once        11/06/21 1030             Note:  This document was prepared using Dragon voice recognition software and may include unintentional dictation errors.   Lavonia Drafts, MD 11/06/21 [phone]

## 2021-11-06 NOTE — Discharge Instructions (Addendum)
Your urine is reassuring, if you continue to have symptoms of a UTI, please start the antibiotics prescribed

## 2021-11-06 NOTE — ED Triage Notes (Signed)
Pt presents tonight for complaints of generalized lower abdominal pain that started about 2 hours ago. Pt endorses nausea but denies vomiting since the pain began. Endorses frequent urination. Denies CP or SOB.

## 2022-01-16 ENCOUNTER — Other Ambulatory Visit: Payer: Self-pay

## 2022-01-16 ENCOUNTER — Ambulatory Visit
Admission: EM | Admit: 2022-01-16 | Discharge: 2022-01-16 | Disposition: A | Discharge status: Home / Self Care | Payer: BLUE CROSS/BLUE SHIELD | Attending: Physician Assistant | Admitting: Physician Assistant

## 2022-01-16 ENCOUNTER — Encounter: Payer: Self-pay | Admitting: Emergency Medicine

## 2022-01-16 DIAGNOSIS — B354 Tinea corporis: Secondary | ICD-10-CM

## 2022-01-16 DIAGNOSIS — H6121 Impacted cerumen, right ear: Secondary | ICD-10-CM

## 2022-01-16 MED ORDER — CLOTRIMAZOLE-BETAMETHASONE 1-0.05 % EX CREA: TOPICAL_CREAM | CUTANEOUS | 0 refills | Status: DC

## 2022-01-16 NOTE — ED Provider Notes (Signed)
?[place] ? ? ? ?CSN: [id number] ?Arrival date & time: 01/16/22  1116 ? ? ?  ? ?History   ?Chief Complaint ?Chief Complaint  ?Patient presents with  ? Otalgia  ?  right  ? ? ?HPI ?Gabrielle Brooks is a 26 y.o. female presenting for 4-day history of right-sided ear pressure and fullness.  Patient reports it felt a little wet so she used a Q-tip.  The ear became more muffled after that.  Patient reports she been having some issues with pain in this ear off and on related to allergies, but symptoms now feel different.  Denies any drainage from the ear.  No reports of fever.  Patient says nasal congestion is under control.  Has tried to use hydrogen peroxide in the ear without improvement in symptoms.  No other complaints. ? ?HPI ? ?Past Medical History:  ?Diagnosis Date  ? Constipation   ? Hemorrhoid   ? ? ?Patient Active Problem List  ? Diagnosis Date Noted  ? Indication for care in labor or delivery 01/17/2018  ? Pregnancy 01/17/2018  ? 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 Chronic pain of right knee 05/13/2016  ? Tobacco use 01/11/2016  ? 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 Allergic rhinitis 05/12/2011  ? ? ?Past Surgical History:  ?Procedure Laterality Date  ? Hemorroid Removal    ? HERNIA REPAIR    ? NOSE SURGERY    ? ? ?OB History   ? ? Gravida  ?1  ? Para  ?1  ? Term  ?0  ? Preterm  ?1  ? AB  ?   ? Living  ?1  ?  ? ? SAB  ?   ? IAB  ?   ? Ectopic  ?   ? Multiple  ?0  ? Live Births  ?1  ?   ?  ?  ? ? ? ?Home Medications   ? ?Prior to Admission medications   ?Medication Sig Start Date End Date Taking? Authorizing Provider  ?clotrimazole-betamethasone (LOTRISONE) cream Apply to affected area 2 times daily prn 01/16/22  Yes Danton Clap, PA-C  ? ? ?Family History ?Family History  ?Problem Relation Age of Onset  ? Fibroids Mother   ? Healthy Father   ? ADD / ADHD Neg Hx   ? Alcohol abuse Neg Hx   ? Anxiety disorder Neg Hx   ? Arthritis Neg Hx   ? Asthma  Neg Hx   ? Birth defects Neg Hx   ? Cancer Neg Hx   ? COPD Neg Hx   ? Depression Neg Hx   ? Diabetes Neg Hx   ? Drug abuse Neg Hx   ? Early death Neg Hx   ? Hearing loss Neg Hx   ? Heart disease Neg Hx   ? Kidney disease Neg Hx   ? Varicose Veins Neg Hx   ? Vision loss Neg Hx   ? Stroke Neg Hx   ? Obesity Neg Hx   ? Miscarriages / Stillbirths Neg Hx   ? Learning disabilities Neg Hx   ? Intellectual disability Neg Hx   ? Hypertension Neg Hx   ? Hyperlipidemia Neg Hx   ? ? ?Social History ?Social History  ? ?Tobacco Use  ? Smoking status: Every Day  ?  Types: Cigarettes  ? Smokeless tobacco: Never  ? Tobacco comments:  ?  2 cig per day  ?Vaping Use  ? Vaping  Use: Never used  ?Substance Use Topics  ? Alcohol use: Yes  ?  Comment: occ  ? Drug use: Yes  ?  Frequency: 4.0 times per week  ?  Types: Marijuana  ? ? ? ?Allergies   ?Patient has no known allergies. ? ? ?Review of Systems ?Review of Systems  ?Constitutional:  Negative for chills, diaphoresis, fatigue and fever.  ?HENT:  Positive for ear pain. Negative for congestion, rhinorrhea, sinus pressure, sinus pain and sore throat.   ?Respiratory:  Negative for cough and shortness of breath.   ?Gastrointestinal:  Negative for abdominal pain, nausea and vomiting.  ?Musculoskeletal:  Negative for arthralgias and myalgias.  ?Skin:  Positive for rash.  ?Neurological:  Negative for weakness and headaches.  ?Hematological:  Negative for adenopathy.  ? ? ?Physical Exam ?Triage Vital Signs ?ED Triage Vitals  ?Enc Vitals Group  ?   BP 01/16/22 1134 119/89  ?   Pulse Rate 01/16/22 1134 78  ?   Resp 01/16/22 1134 16  ?   Temp 01/16/22 1134 98.3 ?F (36.8 ?C)  ?   Temp Source 01/16/22 1134 Oral  ?   SpO2 01/16/22 1134 100 %  ?   Weight 01/16/22 1132 175 lb 0.7 oz (79.4 kg)  ?   Height 01/16/22 1132 '5\' 11"'$  (1.803 m)  ?   Head Circumference --   ?   Peak Flow --   ?   Pain Score 01/16/22 1131 4  ?   Pain Loc --   ?   Pain Edu? --   ?   Excl. in [place]? --   ? ?No data found. ? ?Updated  Vital Signs ?BP 119/89 (BP Location: Left Arm)   Pulse 78   Temp 98.3 ?F (36.8 ?C) (Oral)   Resp 16   Ht '5\' 11"'$  (1.803 m)   Wt 175 lb 0.7 oz (79.4 kg)   LMP 01/10/2022 (Approximate)   SpO2 100%   BMI 24.41 kg/m?  ? ? ?Physical Exam ?Vitals and nursing note reviewed.  ?Constitutional:   ?   General: She is not in acute distress. ?   Appearance: Normal appearance. She is not ill-appearing or toxic-appearing.  ?HENT:  ?   Head: Normocephalic and atraumatic.  ?   Right Ear: External ear normal.  ?   Left Ear: Tympanic membrane, ear canal and external ear normal.  ?   Ears:  ?   Comments: Positive cerumen impaction right EAC ?   Nose: Nose normal.  ?   Mouth/Throat:  ?   Mouth: Mucous membranes are moist.  ?   Pharynx: Oropharynx is clear.  ?Eyes:  ?   General: No scleral icterus.    ?   Right eye: No discharge.     ?   Left eye: No discharge.  ?   Conjunctiva/sclera: Conjunctivae normal.  ?Cardiovascular:  ?   Rate and Rhythm: Normal rate and regular rhythm.  ?   Heart sounds: Normal heart sounds.  ?Pulmonary:  ?   Effort: Pulmonary effort is normal. No respiratory distress.  ?   Breath sounds: Normal breath sounds.  ?Musculoskeletal:  ?   Cervical back: Neck supple.  ?Skin: ?   General: Skin is dry.  ?   Findings: Rash (Large dry patchy hyperpigmented rash medial right knee. Ovular dry hyperpigmented rash posterior thigh with central clearing) present.  ?Neurological:  ?   General: No focal deficit present.  ?   Mental Status: She is alert. Mental status is  at baseline.  ?   Motor: No weakness.  ?   Gait: Gait normal.  ?Psychiatric:     ?   Mood and Affect: Mood normal.     ?   Behavior: Behavior normal.     ?   Thought Content: Thought content normal.  ? ? ? ?UC Treatments / Results  ?Labs ?(all labs ordered are listed, but only abnormal results are displayed) ?Labs Reviewed - No data to display ? ?EKG ? ? ?Radiology ?No results found. ? ?Procedures ?Procedures (including critical care time) ? ?Medications  Ordered in UC ?Medications - No data to display ? ?Initial Impression / Assessment and Plan / UC Course  ?I have reviewed the triage vital signs and the nursing notes. ? ?Pertinent labs & imaging results that were available during my care of the patient were reviewed by me and considered in my medical decision making (see chart for details). ? ?26 year old female presenting for right ear pain and pressure.  On exam she does have cerumen impaction of the right EAC.  Otic lavage performed by nursing staff.  After otic lavage performed, endoscopic exam does show mild effusion of bilateral TMs with clear fluid.  No evidence of infection.  Discussed this with her.  Advised to continue her allergy medicine.  She did report relief of the ear pain and pressure after the otic lavage. ? ?Patient also presenting for rash of right medial knee and left posterior thigh.  Rash is consistent with tinea corporis.  Sent Lotrisone. ? ? ?Final Clinical Impressions(s) / UC Diagnoses  ? ?Final diagnoses:  ?Impacted cerumen of right ear  ?Tinea corporis  ? ?Discharge Instructions   ?None ?  ? ?ED Prescriptions   ? ? Medication Sig Dispense Auth. Provider  ? clotrimazole-betamethasone (LOTRISONE) cream Apply to affected area 2 times daily prn 30 g Laurene Footman B, PA-C  ? ?  ? ?PDMP not reviewed this encounter. ?  ?Danton Clap, PA-C ?01/16/22 1244 ? ?

## 2022-01-16 NOTE — ED Triage Notes (Signed)
Pt c/o right ear pain, and fullness. Started about 4 days ago. She states she has also been having a lot of itching in her ear and used a q tip and today she cant hear out of.  ?

## 2022-01-31 ENCOUNTER — Ambulatory Visit: Payer: Self-pay | Admitting: Nurse Practitioner

## 2022-03-16 ENCOUNTER — Encounter: Payer: BLUE CROSS/BLUE SHIELD | Admitting: Nurse Practitioner

## 2022-03-16 DIAGNOSIS — Z0289 Encounter for other administrative examinations: Secondary | ICD-10-CM

## 2022-05-30 ENCOUNTER — Encounter: Payer: BLUE CROSS/BLUE SHIELD | Admitting: Radiology

## 2022-05-30 ENCOUNTER — Ambulatory Visit: Payer: BLUE CROSS/BLUE SHIELD | Admitting: Physician Assistant

## 2022-06-24 ENCOUNTER — Ambulatory Visit: Payer: BLUE CROSS/BLUE SHIELD | Admitting: Physician Assistant

## 2022-06-24 ENCOUNTER — Encounter: Payer: Self-pay | Admitting: Radiology

## 2022-06-24 ENCOUNTER — Other Ambulatory Visit (HOSPITAL_COMMUNITY)
Admission: RE | Admit: 2022-06-24 | Discharge: 2022-06-24 | Disposition: A | Discharge status: Home / Self Care | Payer: BLUE CROSS/BLUE SHIELD | Source: Ambulatory Visit | Attending: Radiology | Admitting: Radiology

## 2022-06-24 ENCOUNTER — Ambulatory Visit (INDEPENDENT_AMBULATORY_CARE_PROVIDER_SITE_OTHER): Payer: BLUE CROSS/BLUE SHIELD | Admitting: Radiology

## 2022-06-24 VITALS — BP 112/72 | Ht 70.5 in | Wt 188.0 lb

## 2022-06-24 DIAGNOSIS — Z01419 Encounter for gynecological examination (general) (routine) without abnormal findings: Secondary | ICD-10-CM | POA: Insufficient documentation

## 2022-06-24 DIAGNOSIS — N898 Other specified noninflammatory disorders of vagina: Secondary | ICD-10-CM

## 2022-06-24 DIAGNOSIS — Z113 Encounter for screening for infections with a predominantly sexual mode of transmission: Secondary | ICD-10-CM | POA: Insufficient documentation

## 2022-06-24 NOTE — Progress Notes (Signed)
   Gabrielle Brooks 05-Nov-1995 [id number]   History:  26 y.o. G1P1 presents for annual exam. C/o vaginal odor worse after periods. Treated with flagyl last month. Elects for STI screening today. No other gyn concerns.  Gynecologic History Patient's last menstrual period was 06/03/2022 (approximate). Period Cycle (Days): 28 Period Duration (Days): 7 Period Pattern: Regular Menstrual Flow: Heavy Menstrual Control: Maxi pad, Tampon Dysmenorrhea: (!) Moderate (modertae to severe for first few days) Dysmenorrhea Symptoms: Nausea Contraception/Family planning:  female partner Sexually active: yes Last Pap: 2019. Results were: normal   Obstetric History OB History  Gravida Para Term Preterm AB Living  1 1 0 1   1  SAB IAB Ectopic Multiple Live Births        0 1    # Outcome Date GA Lbr Len/2nd Weight Sex Delivery Anes PTL Lv  1 Preterm 01/17/18 [id number] / 01:25 6 lb 0.7 oz (2.74 kg) M Vag-Spont EPI  LIV     The following portions of the patient's history were reviewed and updated as appropriate: allergies, current medications, past family history, past medical history, past social history, past surgical history, and problem list.  Review of Systems Pertinent items noted in HPI and remainder of comprehensive ROS otherwise negative.   Past medical history, past surgical history, family history and social history were all reviewed and documented in the EPIC chart.   Exam:  Vitals:   06/24/22 0917  BP: 112/72  Weight: 188 lb (85.3 kg)  Height: 5' 10.5" (1.791 m)   Body mass index is 26.59 kg/m.  General appearance:  Normal Thyroid:  Symmetrical, normal in size, without palpable masses or nodularity. Respiratory  Auscultation:  Clear without wheezing or rhonchi Cardiovascular  Auscultation:  Regular rate, without rubs, murmurs or gallops  Edema/varicosities:  Not grossly evident Abdominal  Soft,nontender, without masses, guarding or rebound.  Liver/spleen:  No  organomegaly noted  Hernia:  None appreciated  Skin  Inspection:  Grossly normal Breasts: Examined lying and sitting.   Right: Without masses, retractions, nipple discharge or axillary adenopathy.   Left: Without masses, retractions, nipple discharge or axillary adenopathy. Genitourinary   Inguinal/mons:  Normal without inguinal adenopathy  External genitalia:  Normal appearing vulva with no masses, tenderness, or lesions  BUS/Urethra/Skene's glands:  Normal without masses or exudate  Vagina:  Normal appearing with normal color and discharge, no lesions  Cervix:  Normal appearing without discharge or lesions  Uterus:  Normal in size, shape and contour.  Mobile, nontender  Adnexa/parametria:     Rt: Normal in size, without masses or tenderness.   Lt: Normal in size, without masses or tenderness.  Anus and perineum: Normal   Patient informed chaperone available to be present for breast and pelvic exam. Patient has requested no chaperone to be present. Patient has been advised what will be completed during breast and pelvic exam.   Assessment/Plan:   1. Well woman exam with routine gynecological exam  - Cytology - PAP( [facility])  2. Screen for STD (sexually transmitted disease)  - Cytology - PAP( ) - RPR - HIV antibody (with reflex) - Hepatitis C antibody  3. Vaginal odor None currently, discussed prevention of BV     Discussed SBE, pap and STI screening as directed/appropriate. Recommend 1563ms of exercise weekly, including weight bearing exercise. Encouraged the use of seatbelts and sunscreen. Return in 1 year for annual or as needed.   CHRubbie Battiest WHNP-BC 9:45 AM 06/24/2022

## 2022-06-27 ENCOUNTER — Other Ambulatory Visit: Payer: Self-pay | Admitting: *Deleted

## 2022-06-27 LAB — HEPATITIS C ANTIBODY: Hepatitis C Ab: NONREACTIVE

## 2022-06-27 LAB — CYTOLOGY - PAP
Chlamydia: NEGATIVE
Comment: NEGATIVE
Comment: NEGATIVE
Comment: NORMAL
Diagnosis: NEGATIVE
Neisseria Gonorrhea: NEGATIVE
Trichomonas: NEGATIVE

## 2022-06-27 LAB — RPR: RPR Ser Ql: NONREACTIVE

## 2022-06-27 LAB — HIV ANTIBODY (ROUTINE TESTING W REFLEX): HIV 1&2 Ab, 4th Generation: NONREACTIVE

## 2022-06-27 MED ORDER — METRONIDAZOLE 0.75 % VA GEL: 1.0000 | Freq: Every day | VAGINAL | 0 refills | Status: DC

## 2022-10-05 ENCOUNTER — Other Ambulatory Visit: Payer: Self-pay

## 2022-10-05 ENCOUNTER — Other Ambulatory Visit: Payer: Self-pay | Admitting: Radiology

## 2022-10-05 MED ORDER — METRONIDAZOLE 0.75 % VA GEL: 1.0000 | Freq: Every day | VAGINAL | 0 refills | Status: DC

## 2023-08-30 ENCOUNTER — Encounter: Payer: Self-pay | Admitting: Radiology

## 2023-08-30 ENCOUNTER — Telehealth: Payer: Self-pay | Admitting: *Deleted

## 2023-08-30 ENCOUNTER — Ambulatory Visit (INDEPENDENT_AMBULATORY_CARE_PROVIDER_SITE_OTHER): Payer: PRIVATE HEALTH INSURANCE | Admitting: Radiology

## 2023-08-30 VITALS — BP 122/74 | Ht 70.0 in | Wt 210.0 lb

## 2023-08-30 DIAGNOSIS — Z01419 Encounter for gynecological examination (general) (routine) without abnormal findings: Secondary | ICD-10-CM

## 2023-08-30 DIAGNOSIS — R102 Pelvic and perineal pain: Secondary | ICD-10-CM | POA: Diagnosis not present

## 2023-08-30 DIAGNOSIS — N926 Irregular menstruation, unspecified: Secondary | ICD-10-CM

## 2023-08-30 DIAGNOSIS — N6311 Unspecified lump in the right breast, upper outer quadrant: Secondary | ICD-10-CM

## 2023-08-30 DIAGNOSIS — N943 Premenstrual tension syndrome: Secondary | ICD-10-CM

## 2023-08-30 NOTE — Telephone Encounter (Signed)
-----   Message from [place], [place] B sent at 08/30/2023  3:00 PM EST ----- Regarding: breast u/s Please schedule pt for u/s of right breast  2cm mass upper outer quadrant at 10:30

## 2023-08-30 NOTE — Progress Notes (Signed)
Gabrielle Brooks 1996/06/17 [id number]   History:  27 y.o. G1P1 presents for annual exam.Complains of irregular periods with large clots/tissue, skipped period 06/2023, LMP: 08/06/23 , pulsing sensation in abdomen. No new partners. No vaginal symptoms. Does reports worsening mood swings before menses. C/o mass in right breast x 2 months, tender at times.  Gynecologic History Patient's last menstrual period was 08/06/2023 (exact date). Period Pattern: (!) Irregular Menstrual Flow: Moderate, Light Menstrual Control: Thin pad, Maxi pad, Tampon Dysmenorrhea: (!) Moderate Dysmenorrhea Symptoms: Cramping Contraception/Family planning:  female partner Sexually active: yes Last Pap: 2023. Results were: normal   Obstetric History OB History  Gravida Para Term Preterm AB Living  1 1 0 1   1  SAB IAB Ectopic Multiple Live Births        0 1    # Outcome Date GA Lbr Len/2nd Weight Sex Type Anes PTL Lv  1 Preterm 01/17/18 [redacted]w[redacted]d 02:32 / 01:25 6 lb 0.7 oz (2.74 kg) M Vag-Spont EPI  LIV    The following portions of the patient's history were reviewed and updated as appropriate: allergies, current medications, past family history, past medical history, past social history, past surgical history, and problem list.  Review of Systems  All other systems reviewed and are negative.   Past medical history, past surgical history, family history and social history were all reviewed and documented in the EPIC chart.  Exam:  Vitals:   08/30/23 1433  BP: 122/74  Weight: 210 lb (95.3 kg)  Height: 5\' 10"  (1.778 m)   Body mass index is 30.13 kg/m.  Physical Exam Vitals and nursing note reviewed. Exam conducted with a chaperone present.  Constitutional:      Appearance: Normal appearance. She is normal weight.  HENT:     Head: Normocephalic and atraumatic.  Neck:     Thyroid: No thyroid mass, thyromegaly or thyroid tenderness.  Cardiovascular:     Rate and Rhythm: Regular rhythm.     Heart  sounds: Normal heart sounds.  Pulmonary:     Effort: Pulmonary effort is normal.     Breath sounds: Normal breath sounds.  Chest:  Breasts:    Breasts are symmetrical.     Right: Normal. No inverted nipple, mass, nipple discharge, skin change or tenderness.     Left: Normal. No inverted nipple, mass, nipple discharge, skin change or tenderness.  Abdominal:     General: Abdomen is flat. Bowel sounds are normal.     Palpations: Abdomen is soft.  Genitourinary:    General: Normal vulva.     Vagina: Normal. No vaginal discharge, bleeding or lesions.     Cervix: Normal. No discharge or lesion.     Uterus: Normal. Not enlarged and not tender.      Adnexa: Right adnexa normal and left adnexa normal.       Right: No mass, tenderness or fullness.         Left: No mass, tenderness or fullness.    Lymphadenopathy:     Upper Body:     Right upper body: No axillary adenopathy.     Left upper body: No axillary adenopathy.  Skin:    General: Skin is warm and dry.  Neurological:     Mental Status: She is alert and oriented to person, place, and time.  Psychiatric:        Mood and Affect: Mood normal.        Thought Content: Thought content normal.  Judgment: Judgment normal.      Raynelle Fanning, CMA present for exam  Assessment/Plan:   1. Well woman exam with routine gynecological exam Pap 2026  2. Pelvic pain - US Transvaginal Non-OB; Future  3. Irregular menses  4. PMS (premenstrual syndrome)  5. Mass of upper outer quadrant of right breast  Dx u/s ordered  Will discuss options for treatment of PMS and irregular menses after u/s  Brittinie Wherley B WHNP-BC 3:01 PM 08/30/2023

## 2023-09-04 NOTE — Telephone Encounter (Signed)
Spoke with Destiny at [facility]. Patient scheduled for right breast US on 09/20/23 at 0800.   Spoke with patient. Advised as seen above. Patient provided contact information for TBC.   Routing to provider for final review. Patient is agreeable to disposition. Will close encounter.

## 2023-09-20 ENCOUNTER — Other Ambulatory Visit: Payer: Managed Care, Other (non HMO)

## 2023-10-11 ENCOUNTER — Inpatient Hospital Stay: Admission: RE | Admit: 2023-10-11 | Payer: Managed Care, Other (non HMO) | Source: Ambulatory Visit

## 2023-10-12 ENCOUNTER — Other Ambulatory Visit: Payer: Managed Care, Other (non HMO)

## 2023-10-12 ENCOUNTER — Other Ambulatory Visit: Payer: Managed Care, Other (non HMO) | Admitting: Radiology

## 2023-10-24 ENCOUNTER — Ambulatory Visit: Payer: PRIVATE HEALTH INSURANCE

## 2023-10-24 ENCOUNTER — Encounter: Payer: Self-pay | Admitting: Radiology

## 2023-10-24 NOTE — Progress Notes (Signed)
Erroneous encounter- pt declines ultrasound

## 2023-10-25 ENCOUNTER — Telehealth: Payer: Self-pay | Admitting: *Deleted

## 2023-10-25 NOTE — Telephone Encounter (Signed)
-----   Message from [place] L sent at 10/24/2023  3:37 PM EST ----- Regarding: breast u/s The pt came in today from [place], she thought the u/s was for the breast but it was a regular one. She is needing the order sent as soon as possible she goes back to [place] on Friday. She would like a call back from triage once this is done. Thank you :)  Marchelle Folks

## 2023-10-25 NOTE — Telephone Encounter (Signed)
Call returned to patient. Left detailed message, ok per dpr. Advised right breast US order at [facility], initially scheduled by office, order still active for TBC. Contact the office for any additional questions at [phone], opt 4. Call TBC directly at [phone] to schedule right breast US.

## 2023-10-26 NOTE — Telephone Encounter (Signed)
Per review of EPIC, patient is scheduled for breast US at [facility] on 10/27/23.   Routing FYI.   Encounter closed.

## 2023-10-27 ENCOUNTER — Ambulatory Visit
Admission: RE | Admit: 2023-10-27 | Discharge: 2023-10-27 | Disposition: A | Discharge status: Home / Self Care | Payer: No Typology Code available for payment source | Source: Ambulatory Visit | Attending: Radiology | Admitting: Radiology

## 2023-10-27 ENCOUNTER — Other Ambulatory Visit: Payer: Self-pay | Admitting: Radiology

## 2023-10-27 DIAGNOSIS — N6311 Unspecified lump in the right breast, upper outer quadrant: Secondary | ICD-10-CM

## 2023-11-10 ENCOUNTER — Other Ambulatory Visit: Payer: Self-pay | Admitting: Radiology

## 2023-11-10 ENCOUNTER — Ambulatory Visit
Admission: RE | Admit: 2023-11-10 | Discharge: 2023-11-10 | Disposition: A | Discharge status: Home / Self Care | Payer: No Typology Code available for payment source | Source: Ambulatory Visit | Attending: Radiology | Admitting: Radiology

## 2023-11-10 ENCOUNTER — Encounter: Payer: Self-pay | Admitting: Radiology

## 2023-11-10 DIAGNOSIS — N6311 Unspecified lump in the right breast, upper outer quadrant: Secondary | ICD-10-CM

## 2023-11-10 HISTORY — PX: BREAST BIOPSY: SHX20

## 2023-11-13 LAB — SURGICAL PATHOLOGY

## 2024-07-24 ENCOUNTER — Other Ambulatory Visit: Payer: Self-pay

## 2024-07-24 DIAGNOSIS — R350 Frequency of micturition: Secondary | ICD-10-CM

## 2024-07-24 MED ORDER — SULFAMETHOXAZOLE-TRIMETHOPRIM 800-160 MG PO TABS: 1.0000 | ORAL_TABLET | Freq: Two times a day (BID) | ORAL | 0 refills | Status: AC

## 2024-07-24 NOTE — Telephone Encounter (Signed)
 Last UA done was 2023- what symptoms is she having?

## 2024-07-24 NOTE — Telephone Encounter (Signed)
 Ok to send bactrim DS po BID x 3 days #6

## 2024-09-02 ENCOUNTER — Other Ambulatory Visit: Payer: Self-pay | Admitting: Radiology

## 2024-09-02 DIAGNOSIS — R350 Frequency of micturition: Secondary | ICD-10-CM

## 2024-09-03 NOTE — Telephone Encounter (Addendum)
 Med refill request:   sulfamethoxazole -trimethoprim  (BACTRIM  DS) 800-160 MG tablet  Start:  07/24/24 Disp:  6 tablets Refills:  0  Last AEX:  08/30/23 Next AEX:  Not yet scheduled *Front desk has been asked to contact Pt to schedule an annual visit. Last MMG (if hormonal med):  N/A Refill authorized? Please Advise.

## 2024-09-24 ENCOUNTER — Telehealth: Payer: Self-pay | Admitting: Radiology

## 2024-09-24 NOTE — Telephone Encounter (Signed)
 See refill encounter dated 09/02/24. Will close this encounter

## 2024-09-24 NOTE — Telephone Encounter (Signed)
"   Left two voicemail to call to schedule annual with NP chrzanowski as she had her last annual on 08/30/23. RX medication is being requested no annual schedule as of yet.  "

## 2024-09-24 NOTE — Telephone Encounter (Signed)
 Gabrielle Brooks CROME [facility]   09/24/24  9:24 AM Note  Left two voicemail to call to schedule annual with NP chrzanowski as she had her last annual on 08/30/23. RX medication is being requested no annual schedule as of yet.        09/24/24  9:06 AM Gabrielle Brooks L routed this conversation to Me Gabrielle Brooks CROME to [place], [place] [facility]    09/04/24  9:00 AM Left two voicemail to call to schedule annual with NP chrzanowski as she had her last annual on 08/30/23. RX medication is being requested no annual schedule as of yet.
